# Patient Record
Sex: Male | Born: 1970 | Race: White | Hispanic: No | Smoking: Former smoker
Health system: Southern US, Community
[De-identification: ages and names within clinical notes are randomized; demographics above are authoritative.]

## PROBLEM LIST (undated history)

## (undated) DIAGNOSIS — I5022 Chronic systolic (congestive) heart failure: Secondary | ICD-10-CM

## (undated) DIAGNOSIS — Z95810 Presence of automatic (implantable) cardiac defibrillator: Secondary | ICD-10-CM

## (undated) DIAGNOSIS — J189 Pneumonia, unspecified organism: Secondary | ICD-10-CM

## (undated) DIAGNOSIS — I739 Peripheral vascular disease, unspecified: Secondary | ICD-10-CM

## (undated) DIAGNOSIS — I219 Acute myocardial infarction, unspecified: Secondary | ICD-10-CM

## (undated) DIAGNOSIS — I10 Essential (primary) hypertension: Secondary | ICD-10-CM

## (undated) DIAGNOSIS — R0600 Dyspnea, unspecified: Secondary | ICD-10-CM

## (undated) DIAGNOSIS — I2510 Atherosclerotic heart disease of native coronary artery without angina pectoris: Secondary | ICD-10-CM

## (undated) DIAGNOSIS — K219 Gastro-esophageal reflux disease without esophagitis: Secondary | ICD-10-CM

## (undated) DIAGNOSIS — I82409 Acute embolism and thrombosis of unspecified deep veins of unspecified lower extremity: Secondary | ICD-10-CM

## (undated) HISTORY — PX: KNEE ARTHROSCOPY: SUR90

## (undated) HISTORY — PX: SHOULDER ARTHROSCOPY: SHX128

## (undated) HISTORY — PX: NASAL SEPTUM SURGERY: SHX37

## (undated) HISTORY — PX: WRIST SURGERY: SHX841

## (undated) HISTORY — PX: CHOLECYSTECTOMY: SHX55

## (undated) HISTORY — PX: OTHER SURGICAL HISTORY: SHX169

## (undated) HISTORY — PX: CARDIAC CATHETERIZATION: SHX172

## (undated) HISTORY — PX: CORONARY ANGIOPLASTY: SHX604

---
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
## 2011-08-17 DIAGNOSIS — J439 Emphysema, unspecified: Secondary | ICD-10-CM | POA: Insufficient documentation

## 2013-08-02 LAB — TSH: TSH: 0.68 (ref 0.41–5.90)

## 2014-03-10 DIAGNOSIS — I10 Essential (primary) hypertension: Secondary | ICD-10-CM | POA: Insufficient documentation

## 2014-03-10 DIAGNOSIS — K219 Gastro-esophageal reflux disease without esophagitis: Secondary | ICD-10-CM | POA: Insufficient documentation

## 2015-12-16 DIAGNOSIS — M1711 Unilateral primary osteoarthritis, right knee: Secondary | ICD-10-CM | POA: Insufficient documentation

## 2016-04-29 DIAGNOSIS — I2510 Atherosclerotic heart disease of native coronary artery without angina pectoris: Secondary | ICD-10-CM | POA: Insufficient documentation

## 2016-05-30 DIAGNOSIS — Z95810 Presence of automatic (implantable) cardiac defibrillator: Secondary | ICD-10-CM

## 2016-05-30 HISTORY — PX: ICD IMPLANT: EP1208

## 2016-05-30 HISTORY — DX: Presence of automatic (implantable) cardiac defibrillator: Z95.810

## 2016-07-28 DIAGNOSIS — N522 Drug-induced erectile dysfunction: Secondary | ICD-10-CM | POA: Insufficient documentation

## 2016-07-28 DIAGNOSIS — I252 Old myocardial infarction: Secondary | ICD-10-CM | POA: Insufficient documentation

## 2016-07-29 LAB — LIPID PANEL
Cholesterol: 112 (ref 0–200)
HDL: 34 — AB (ref 35–70)
LDL Cholesterol: 42
Triglycerides: 116 (ref 40–160)

## 2017-03-26 DIAGNOSIS — I213 ST elevation (STEMI) myocardial infarction of unspecified site: Secondary | ICD-10-CM | POA: Insufficient documentation

## 2017-03-27 DIAGNOSIS — Z95810 Presence of automatic (implantable) cardiac defibrillator: Secondary | ICD-10-CM | POA: Insufficient documentation

## 2017-03-27 DIAGNOSIS — I4901 Ventricular fibrillation: Secondary | ICD-10-CM | POA: Insufficient documentation

## 2017-03-27 DIAGNOSIS — I255 Ischemic cardiomyopathy: Secondary | ICD-10-CM | POA: Insufficient documentation

## 2017-10-13 LAB — HEPATIC FUNCTION PANEL
ALT: 24 (ref 10–40)
AST: 19 (ref 14–40)
Alkaline Phosphatase: 70 (ref 25–125)
Bilirubin, Total: 0.4

## 2017-10-13 LAB — CBC AND DIFFERENTIAL
HCT: 40 — AB (ref 41–53)
Hemoglobin: 13.5 (ref 13.5–17.5)
Platelets: 216 (ref 150–399)
WBC: 7.4

## 2017-10-13 LAB — HEMOGLOBIN A1C: Hemoglobin A1C: 5.6

## 2017-10-13 LAB — BASIC METABOLIC PANEL
BUN: 12 (ref 4–21)
Creatinine: 0.9 (ref 0.6–1.3)
Glucose: 95
Potassium: 4.5 (ref 3.4–5.3)
Sodium: 140 (ref 137–147)

## 2018-02-20 NOTE — H&P (Signed)
  Patient's anticipated LOS is less than 2 midnights, meeting these requirements: - Younger than 26 - Lives within 1 hour of care - Has a competent adult at home to recover with post-op recover - NO history of  - Chronic pain requiring opiods  - Diabetes  - Coronary Artery Disease  - Heart failure  - Heart attack  - Stroke  - DVT/VTE  - Cardiac arrhythmia  - Respiratory Failure/COPD  - Renal failure  - Anemia  - Advanced Liver disease       Shawn Burke is an 47 y.o. male.    Chief Complaint: right shoulder pain  HPI: Pt is a 47 y.o. male complaining of right shoulder pain for multiple months. Pain had continually increased since the beginning. X-rays in the clinic show rotator cuff tear right shoulder. Pt has tried various conservative treatments which have failed to alleviate their symptoms, including injections and therapy. Various options are discussed with the patient. Risks, benefits and expectations were discussed with the patient. Patient understand the risks, benefits and expectations and wishes to proceed with surgery.   PCP:  System, Pcp Not In  D/C Plans: Home  PMH: No past medical history on file.   Social History:  has no tobacco, alcohol, and drug history on file.  Allergies:  Allergies  Allergen Reactions  . Coconut Flavor Anaphylaxis, Shortness Of Breath and Swelling    Medications: No current facility-administered medications for this encounter.    Current Outpatient Medications  Medication Sig Dispense Refill  . aspirin EC 81 MG tablet Take 81 mg by mouth daily.    Marland Kitchen atorvastatin (LIPITOR) 80 MG tablet Take 80 mg by mouth daily.    . carvedilol (COREG) 6.25 MG tablet Take 6.25 mg by mouth 2 (two) times daily.    . GOODYS EXTRA STRENGTH 520-260-32.5 MG PACK Take 1 packet by mouth 3 (three) times daily as needed (for headaches/toothaches.).    Marland Kitchen pantoprazole (PROTONIX) 20 MG tablet Take 20 mg by mouth daily before breakfast.    .  spironolactone (ALDACTONE) 25 MG tablet Take 25 mg by mouth daily.    . clopidogrel (PLAVIX) 75 MG tablet Take 75 mg by mouth daily.      No results found for this or any previous visit (from the past 48 hour(s)). No results found.  ROS: Pain with rom of the right upper extremity  Physical Exam: Alert and oriented 47 y.o. male in no acute distress Cranial nerves 2-12 intact Cervical spine: full rom with no tenderness, nv intact distally Chest: active breath sounds bilaterally, no wheeze rhonchi or rales Heart: regular rate and rhythm, no murmur Abd: non tender non distended with active bowel sounds Hip is stable with rom  Right shoulder with mild decrease with rom due to pain nv intact distally No rashes or edema Strength limited to known rotator cuff tear  Assessment/Plan Assessment: right shoulder rotator cuff tear   Plan:  Patient will undergo a right shoulder rotator cuff repair by Dr. Veverly Fells at [facility]. Risks benefits and expectations were discussed with the patient. Patient understand risks, benefits and expectations and wishes to proceed. Preoperative templating of the joint replacement has been completed, documented, and submitted to the Operating Room personnel in order to optimize intra-operative equipment management.   Merla Riches PA-C, MPAS [facility] is now [employer] [address] [place], [place], [place] [address] Phone: [phone] [email or web address] Facebook  [employer]

## 2018-02-22 ENCOUNTER — Encounter (HOSPITAL_COMMUNITY): Payer: Self-pay | Admitting: *Deleted

## 2018-02-22 ENCOUNTER — Other Ambulatory Visit: Payer: Self-pay

## 2018-02-22 NOTE — Progress Notes (Signed)
Spoke with pt for pre-op call. Pt has extensive cardiac history with CAD and stents, multiple MI's and cardiac arrests and an ICD. Pt's cardiology care is in [place] with [place]. Pt's cardiologist is Dr. Trixie Rude at [facility] and V[facility] and Dr. Domenica Fail is his electrophysiologist at [facility] and Vascular at [facility]. Have faxed ICD reprogramming order form to Dr. Hassie Bruce Device clinic. Pt denies any recent chest pain. States he gets sob with climbing stairs at work. Pt states he was told by "somebody" to stop his Plavix 5 days prior to surgery. Last dose was Sunday, 02/18/18. Continuing Aspirin. Pt states he is not diabetic. Spoke with Margarita Grizzle at Dr. Veverly Fells' office and she will fax the cardiac clearance note that they have received.

## 2018-02-22 NOTE — Progress Notes (Signed)
Paged [employer] rep for pt's ICD.

## 2018-02-22 NOTE — Progress Notes (Addendum)
Anesthesia Chart Review: SAME DAY WORK-UP (patient lives is [place])  Case:  [id number] Date/Time:  02/23/18 1315   Procedure:  RIGHT SHOULDER ARTHROSCOPY WITH OPEN ROTATOR CUFF REPAIR, SUBACROMIAL DECOMPRESSION, OPEN DISTAL CLAVICLE RESECTION, OPEN BICEP TENODESIS (Right Shoulder)   Anesthesia type:  Choice   Pre-op diagnosis:  Right shoulder rotator cuff tear   Location:  [facility] OR ROOM 05 / [place] [facility]   Surgeon:  Netta Cedars, MD      DISCUSSION: Patient is a 47 year old male scheduled for the above procedure.  History includes former smoker, CAD/MI (anterior STEMI s/p mLAD stent 04/25/16; inferior STEMI due to pRCA thrombus, s/p DES RCA and PTCA mPDA, residual 80-90% pLAD lesion, LHC complicated by peri-PCI VF arrest with 23 ICD shocks for both VF and afib 03/27/17; inferior STEMI due occluded small distal PDA, no PCI 03/31/17; s/p DES to oLAD and mLAD 04/10/17), ischemic cardiomyopathy, ICD (per EP nurse Juliann Pulse at Dr. Hassie Bruce office: "[employer] Inventra 7VR-T DX single lead RV ICD, serial number [id number]", implanted 09/23/16, left sided; interrogation: remote 01/02/18, office 07/05/17, "underlying sinus rhythm, 0% V-pacing"; PAF noted on 03/30/17 interrogation, afib burden 0.1%), HTN, RLE DVT ~ 2011, GERD.   RCA/PDA disease addressed 03/27/17 [facility]), but procedure complicated by VF arrest, so residual LAD disease not addressed until 04/10/17 [facility]) with PDA re-occluded by that time (03/31/17 [facility], no intervention). He had a high risk stress test 06/2017, but primarily due to low EF as there was no significant ischemia. Last EP cardiology visit 01/26/18 and no additional testing recommended at that time (see below).  - PCP Toma Deiters, FNP cleared for surgery at "moderate risk." - EP cardiologist Dr. Domenica Fail cleared for surgery at "moderate risk." He gave permission to hold Plavix x5 days.  Patient reported last Plavix dose 02/18/18, but is continuing ASA. Reviewed with anesthesiologist  Suella Broad, MD earlier today while awaiting [place] records. Fortunately received records since then confirming that LAD disease has also been addressed. Dr. Hassie Bruce staff would not complete the [facility] cardiac device perioperative form due to being outside of their facility but did fax general [employer] recommendations (on chart). Anesthesiologist to evaluate on the day of surgery. Patient will need labs on the day of surgery.   PROVIDERS: - Toma Deiters, FNP is PCP ([place]; see Care Everywhere). Last visit 02/14/18. Aware of surgery plans. Lisinopril held due to hypotension and call made to cardiology to discuss.   Wende Crease, MD is primary ardiologist at [facility] and Vascular and EP cardiologist is Joylene John, MD. Both are located at [facility] [phone]). Last seen by Dr. Domenica Fail on 01/26/18 for follow-up and preoperative evaluation. (Office note is on chart for review as needed.)    LABS: He is for labs on arrival. In 09/2017 his H/H were 13.5/39.9, PLT 216 and CMET was WNL except elevated albumin/globulin ratio of 2.5 (1.2-2.2).    IMAGES: MRI right shoulder ([facility] Everywhere): IMPRESSION: 1.High-grade bursal surface tear of the anterior mid supraspinatus. 2.Low-grade bursal surface tear of the anteriormost infraspinatus. 3.Low-grade partial articular surface tear of the superiormost subscapularis. 4.Mild acromioclavicular joint degenerative changes. Subacromial spurring and mild subacromial/subdeltoid bursitis. Findings may contribute to impingement. 5.Motion limited examination.   EKG:  - 01/26/18 ([place]): Sinus Rhythm. Anterior infarct, probably recent. Inferior infarct (age undetermined). - 09/07/17 ([place]): SinusRhythm.Extensive anterior-lateral negative T wave, consider lateral ischemia. Inferior infarct(age undetermined)      CV:  Nuclear stress test 07/20/17 ([place]): Conclusions: 1. Myocardial perfusion stress study is abnormal as described below but there is no active ischemia. 2. There is a fixed, large size, severe grade perfusion defect involving the entire apical and periapical segments indicating prior myocardial infarct/scar. In addition, there is a predominantly fixed, large size, moderate grade perfusion defect involving the inferior and inferoseptal walls indicating prior myocardial infarction/scar. No significant reversible perfusion defects suggesting no significant stress-induced myocardial ischemia. 3. Overall left ventricular global systolic function is moderately depressed. The left ventricular end-diastolic volume is 474 mL. The overall calculated left ventricular ejection fraction is 30%. Severe hypokinesis of the inferior, inferoseptal, and apical wall of the left ventricle. 4. High cardiovascular risk study. Degree of risk is primarily based on significantly reduced LVEF.  Cardiac cath/PCI 04/10/17 ([place]): Summary: 1. LAD:  Ostial lesion: 75% stenosis. Balloon angioplasty. 3.83mm x 18 mm Xience Alpine DES placed. 0% residual stenosis with TIMI grade 3 flow (brisk flow).  Mid lesion: 75% stenosis. 2.92mm x 12 mm Xience Alpine DES placed. 0% residual stenosis with TIMI grade 3 flow (brisk flow). Distal lesion: 25%. 2. LCX: Mid lesion: 25% stenosis. Distal lesion: 25% stenosis. 3. OM1: Ostial lesion: 25% stenosis. 4. OM2: Ostial lesion: 50% stenosis. 5. RPDA: Mid lesion: 50% stenosis. 6. LV: Estimated EF 30%. Sever hypokinesis of the apical myocardium. 7. AV: There is no stenosis. 8. MV: There is no regurgitation. 9. 120/6. 9 mm Hg LVEDP, dP/dt=1208 mm Hg/s  Cardiac cath 03/31/17 [facility] [place]):  Procedure Findings 1. Patent recent proximal RCA stent is widely patent with no concerns. 2. The distal rPDA is occluded; this was previously treated with angioplasty. 3. Residual proximal  LAD has severe 80% narrowing.Previous mid LAD stent is widely patent.Beyond the stented segment has focal, eccentric 60-70% narrowed. Plan: culprit for patient's post AMI pain is small distal PDA vessel that was previous treated with only angioplasty; this small branch is now occluded.Repeat PCI not performed. Proximal stent is widely patent with no concerns. Medical therapy.  Cardiac cath 03/27/17 ([place]): SUMMARY: --HEMODYNAMICS: --Hemodynamic assessment demonstrates mildly to moderately elevated LVEDP. --CORONARY CIRCULATION: --Left main: The vessel was large sized. Angiography showed moderate atherosclerosis.  --Proximal LAD: The vessel was large sized. Angiography showed severe atherosclerosis. There was a tubular 90 % stenosis.  --Mid LAD: The vessel was large sized. Angiography showed severe atherosclerosis. There  was a discrete 70 % stenosis. -- Proximal circumflex: The vessel was medium to large sized. Angiography showed moderate atherosclerosis.  --Mid circumflex: The vessel was large sized. Angiography showed moderate atherosclerosis. --3rd obtuse marginal: The vessel was medium sized. Angiography showed moderate atherosclerosis.  --Proximal RCA: The vessel was large sized (dominant). Angiography showed severe atherosclerosis. There was a discrete 99 % stenosis. The lesion was was associated with a large filling defect consistent with thrombus. There was TIMI grade 2 flow through the vessel (partial perfusion). This lesion is a likely culprit for the patient's recent myocardial infarction. It appears amenable to percutaneous intervention.  --Right PDA: The vessel was medium to large sized. Angiography showed severe atherosclerosis. There was a discrete 90 % stenosis. -- INTERVENTIONS/PCI: --A successful stent was performed on the 99 % lesion in the proximal RCA. Following intervention there was an excellent angiographic appearance with a  0 % residual stenosis. --A balloon angioplasty was performed on the 90 % lesion in the right posterior descending artery. Following intervention there was an improved angiographic appearance with  a 50 % residual stenosis. -- SUMMARY: --Acute Inferior STEMI with 99% proximal RCA lesion with heavy thrombus culprit for MI, 90% proximal LAD, 90% RPDA and moderate LCX and LM stenoses. Successful PCI to proximal RCA with placement of a 3.5 X 28 mm Synergy DES and PTCA to the mid PDA lesion. Patient did well but went into VF arrest upon moving from cath table to bed, he was resucitated and intubated. Admitted to CCU.  Echo 03/27/17 ([place]): Interpretation Summary 1. Left ventricle is normal in size with severely reduced function with an EF 25-30%. There is akinesis of the apex, mid/distal septum, and severe hypokinesis of the mid/distal anterior wall, and mid/distal inferior wall. Contrast echo was administered and no obvious LV thrombus. 2. Right ventricle is normal in size and function. Pacer/device lead is noted in right heart. 3. Trivial mitral regurgitation. 4. Atria are normal in size. 5. No pericardial effusion.   Past Medical History:  Diagnosis Date  . AICD (automatic cardioverter/defibrillator) present   . Coronary artery disease    2017 - 1 stent. 5 in October 2018  . GERD (gastroesophageal reflux disease)   . Hypertension   . Myocardial infarction ([place])    x 2 in 02/2017  . Peripheral vascular disease ([facility])    DVT in right leg  . Pneumonia     Past Surgical History:  Procedure Laterality Date  . CARDIAC CATHETERIZATION    . CHOLECYSTECTOMY    . CORONARY ANGIOPLASTY    . KNEE ARTHROSCOPY Right    x 2  . NASAL SEPTUM SURGERY      x 3  . SHOULDER ARTHROSCOPY Left   . WRIST SURGERY Right     MEDICATIONS: No current facility-administered medications for this encounter.    Marland Kitchen aspirin EC 81 MG tablet  . atorvastatin (LIPITOR) 80 MG tablet  .  carvedilol (COREG) 6.25 MG tablet  . GOODYS EXTRA STRENGTH 520-260-32.5 MG PACK  . pantoprazole (PROTONIX) 20 MG tablet  . spironolactone (ALDACTONE) 25 MG tablet  . clopidogrel (PLAVIX) 75 MG tablet    [facility]/Anesthesiology Phone [phone] 02/22/2018 6:46 PM

## 2018-02-22 NOTE — Progress Notes (Signed)
   02/22/18 1202  OBSTRUCTIVE SLEEP APNEA  Have you ever been diagnosed with sleep apnea through a sleep study? No  Do you snore loudly (loud enough to be heard through closed doors)?  1  Do you often feel tired, fatigued, or sleepy during the daytime (such as falling asleep during driving or talking to someone)? 1  Has anyone observed you stop breathing during your sleep? 1  Do you have, or are you being treated for high blood pressure? 1  BMI more than 35 kg/m2? 0  Age > 75 (1-yes) 0  Male Gender (Yes=1) 1  Obstructive Sleep Apnea Score 5  Score 5 or greater  Results sent to PCP

## 2018-02-23 ENCOUNTER — Encounter (HOSPITAL_COMMUNITY): Payer: Self-pay

## 2018-02-23 ENCOUNTER — Ambulatory Visit (HOSPITAL_COMMUNITY): Admitting: Vascular Surgery

## 2018-02-23 ENCOUNTER — Encounter (HOSPITAL_COMMUNITY)
Admission: RE | Disposition: A | Discharge status: Home / Self Care | Payer: Self-pay | Source: Ambulatory Visit | Attending: Orthopedic Surgery

## 2018-02-23 ENCOUNTER — Ambulatory Visit (HOSPITAL_COMMUNITY)
Admission: RE | Admit: 2018-02-23 | Discharge: 2018-02-23 | Disposition: A | Discharge status: Home / Self Care | Source: Ambulatory Visit | Attending: Orthopedic Surgery | Admitting: Orthopedic Surgery

## 2018-02-23 DIAGNOSIS — Z86718 Personal history of other venous thrombosis and embolism: Secondary | ICD-10-CM | POA: Diagnosis not present

## 2018-02-23 DIAGNOSIS — Z87891 Personal history of nicotine dependence: Secondary | ICD-10-CM | POA: Diagnosis not present

## 2018-02-23 DIAGNOSIS — I2510 Atherosclerotic heart disease of native coronary artery without angina pectoris: Secondary | ICD-10-CM | POA: Insufficient documentation

## 2018-02-23 DIAGNOSIS — Z7982 Long term (current) use of aspirin: Secondary | ICD-10-CM | POA: Diagnosis not present

## 2018-02-23 DIAGNOSIS — I252 Old myocardial infarction: Secondary | ICD-10-CM | POA: Insufficient documentation

## 2018-02-23 DIAGNOSIS — K219 Gastro-esophageal reflux disease without esophagitis: Secondary | ICD-10-CM | POA: Insufficient documentation

## 2018-02-23 DIAGNOSIS — Z7902 Long term (current) use of antithrombotics/antiplatelets: Secondary | ICD-10-CM | POA: Diagnosis not present

## 2018-02-23 DIAGNOSIS — M75101 Unspecified rotator cuff tear or rupture of right shoulder, not specified as traumatic: Secondary | ICD-10-CM | POA: Insufficient documentation

## 2018-02-23 DIAGNOSIS — X58XXXA Exposure to other specified factors, initial encounter: Secondary | ICD-10-CM | POA: Diagnosis not present

## 2018-02-23 DIAGNOSIS — Z955 Presence of coronary angioplasty implant and graft: Secondary | ICD-10-CM | POA: Diagnosis not present

## 2018-02-23 DIAGNOSIS — S43431A Superior glenoid labrum lesion of right shoulder, initial encounter: Secondary | ICD-10-CM | POA: Insufficient documentation

## 2018-02-23 DIAGNOSIS — I739 Peripheral vascular disease, unspecified: Secondary | ICD-10-CM | POA: Insufficient documentation

## 2018-02-23 DIAGNOSIS — Z79899 Other long term (current) drug therapy: Secondary | ICD-10-CM | POA: Insufficient documentation

## 2018-02-23 DIAGNOSIS — I10 Essential (primary) hypertension: Secondary | ICD-10-CM | POA: Insufficient documentation

## 2018-02-23 DIAGNOSIS — M19011 Primary osteoarthritis, right shoulder: Secondary | ICD-10-CM | POA: Insufficient documentation

## 2018-02-23 DIAGNOSIS — Z95810 Presence of automatic (implantable) cardiac defibrillator: Secondary | ICD-10-CM | POA: Insufficient documentation

## 2018-02-23 HISTORY — DX: Atherosclerotic heart disease of native coronary artery without angina pectoris: I25.10

## 2018-02-23 HISTORY — PX: SHOULDER ARTHROSCOPY WITH ROTATOR CUFF REPAIR AND SUBACROMIAL DECOMPRESSION: SHX5686

## 2018-02-23 HISTORY — DX: Presence of automatic (implantable) cardiac defibrillator: Z95.810

## 2018-02-23 HISTORY — DX: Pneumonia, unspecified organism: J18.9

## 2018-02-23 HISTORY — DX: Peripheral vascular disease, unspecified: I73.9

## 2018-02-23 HISTORY — DX: Acute myocardial infarction, unspecified: I21.9

## 2018-02-23 HISTORY — DX: Essential (primary) hypertension: I10

## 2018-02-23 HISTORY — DX: Gastro-esophageal reflux disease without esophagitis: K21.9

## 2018-02-23 LAB — COMPREHENSIVE METABOLIC PANEL
ALK PHOS: 60 U/L (ref 38–126)
ALT: 22 U/L (ref 0–44)
AST: 20 U/L (ref 15–41)
Albumin: 4.1 g/dL (ref 3.5–5.0)
Anion gap: 9 (ref 5–15)
BUN: 7 mg/dL (ref 6–20)
CALCIUM: 9.1 mg/dL (ref 8.9–10.3)
CO2: 25 mmol/L (ref 22–32)
Chloride: 108 mmol/L (ref 98–111)
Creatinine, Ser: 0.81 mg/dL (ref 0.61–1.24)
GFR calc Af Amer: >60 mL/min (ref >60)
GFR calc non Af Amer: >60 mL/min (ref >60)
GLUCOSE: 97 mg/dL (ref 70–99)
Potassium: 3.8 mmol/L (ref 3.5–5.1)
SODIUM: 142 mmol/L (ref 135–145)
Total Bilirubin: 1.1 mg/dL (ref 0.3–1.2)
Total Protein: 6.8 g/dL (ref 6.5–8.1)

## 2018-02-23 LAB — CBC
HCT: 42.2 % (ref 39.0–52.0)
HEMOGLOBIN: 13.8 g/dL (ref 13.0–17.0)
MCH: 33.2 pg (ref 26.0–34.0)
MCHC: 32.7 g/dL (ref 30.0–36.0)
MCV: 101.4 fL — ABNORMAL HIGH (ref 78.0–100.0)
Platelets: 205 10*3/uL (ref 150–400)
RBC: 4.16 MIL/uL — AB (ref 4.22–5.81)
RDW: 12.4 % (ref 11.5–15.5)
WBC: 7.9 10*3/uL (ref 4.0–10.5)

## 2018-02-23 SURGERY — SHOULDER ARTHROSCOPY WITH ROTATOR CUFF REPAIR AND SUBACROMIAL DECOMPRESSION
Anesthesia: General | Site: Shoulder | Laterality: Right

## 2018-02-23 MED ORDER — EPHEDRINE 5 MG/ML INJ
INTRAVENOUS | Status: AC
  Filled 2018-02-23: qty 10

## 2018-02-23 MED ORDER — BUPIVACAINE-EPINEPHRINE 0.25% -1:200000 IJ SOLN
INTRAMUSCULAR | Status: DC | PRN
  Administered 2018-02-23: 8 mL

## 2018-02-23 MED ORDER — ROCURONIUM BROMIDE 10 MG/ML (PF) SYRINGE
PREFILLED_SYRINGE | INTRAVENOUS | Status: DC | PRN
  Administered 2018-02-23: 50 mg via INTRAVENOUS

## 2018-02-23 MED ORDER — FENTANYL CITRATE (PF) 100 MCG/2ML IJ SOLN
50.0000 ug | Freq: Once | INTRAMUSCULAR | Status: AC
  Administered 2018-02-23: 50 ug via INTRAVENOUS

## 2018-02-23 MED ORDER — ETOMIDATE 2 MG/ML IV SOLN
INTRAVENOUS | Status: AC
  Filled 2018-02-23: qty 10

## 2018-02-23 MED ORDER — METHOCARBAMOL 500 MG PO TABS: 500.0000 mg | ORAL_TABLET | Freq: Three times a day (TID) | ORAL | 1 refills | Status: DC | PRN

## 2018-02-23 MED ORDER — ETOMIDATE 2 MG/ML IV SOLN
INTRAVENOUS | Status: DC | PRN
  Administered 2018-02-23: 20 mg via INTRAVENOUS

## 2018-02-23 MED ORDER — CHLORHEXIDINE GLUCONATE 4 % EX LIQD: 60.0000 mL | Freq: Once | CUTANEOUS | Status: DC

## 2018-02-23 MED ORDER — ONDANSETRON HCL 4 MG/2ML IJ SOLN
INTRAMUSCULAR | Status: DC | PRN
  Administered 2018-02-23: 4 mg via INTRAVENOUS

## 2018-02-23 MED ORDER — ONDANSETRON HCL 4 MG/2ML IJ SOLN
INTRAMUSCULAR | Status: AC
  Filled 2018-02-23: qty 2

## 2018-02-23 MED ORDER — ROCURONIUM BROMIDE 50 MG/5ML IV SOSY
PREFILLED_SYRINGE | INTRAVENOUS | Status: AC
  Filled 2018-02-23: qty 5

## 2018-02-23 MED ORDER — BUPIVACAINE LIPOSOME 1.3 % IJ SUSP
INTRAMUSCULAR | Status: DC | PRN
  Administered 2018-02-23: 10 mL via PERINEURAL

## 2018-02-23 MED ORDER — FENTANYL CITRATE (PF) 250 MCG/5ML IJ SOLN
INTRAMUSCULAR | Status: DC | PRN
  Administered 2018-02-23: 200 ug via INTRAVENOUS

## 2018-02-23 MED ORDER — SODIUM CHLORIDE 0.9 % IR SOLN
Status: DC | PRN
  Administered 2018-02-23: 1000 mL

## 2018-02-23 MED ORDER — OXYCODONE-ACETAMINOPHEN 7.5-325 MG PO TABS: 1.0000 | ORAL_TABLET | ORAL | 0 refills | Status: DC | PRN

## 2018-02-23 MED ORDER — FENTANYL CITRATE (PF) 250 MCG/5ML IJ SOLN
INTRAMUSCULAR | Status: AC
  Filled 2018-02-23: qty 5

## 2018-02-23 MED ORDER — MIDAZOLAM HCL 2 MG/2ML IJ SOLN
INTRAMUSCULAR | Status: AC
  Administered 2018-02-23: 1 mg via INTRAVENOUS
  Filled 2018-02-23: qty 2

## 2018-02-23 MED ORDER — GLYCOPYRROLATE PF 0.2 MG/ML IJ SOSY
PREFILLED_SYRINGE | INTRAMUSCULAR | Status: DC | PRN
  Administered 2018-02-23: .2 mg via INTRAVENOUS

## 2018-02-23 MED ORDER — PROPOFOL 10 MG/ML IV BOLUS
INTRAVENOUS | Status: AC
  Filled 2018-02-23: qty 20

## 2018-02-23 MED ORDER — MIDAZOLAM HCL 2 MG/2ML IJ SOLN
1.0000 mg | Freq: Once | INTRAMUSCULAR | Status: AC
  Administered 2018-02-23: 1 mg via INTRAVENOUS

## 2018-02-23 MED ORDER — BUPIVACAINE-EPINEPHRINE (PF) 0.25% -1:200000 IJ SOLN
INTRAMUSCULAR | Status: AC
  Filled 2018-02-23: qty 30

## 2018-02-23 MED ORDER — BUPIVACAINE HCL (PF) 0.5 % IJ SOLN
INTRAMUSCULAR | Status: DC | PRN
  Administered 2018-02-23: 10 mL via PERINEURAL

## 2018-02-23 MED ORDER — SODIUM CHLORIDE 0.9 % IV SOLN
INTRAVENOUS | Status: DC | PRN
  Administered 2018-02-23: 30 ug/min via INTRAVENOUS

## 2018-02-23 MED ORDER — ONDANSETRON HCL 4 MG/2ML IJ SOLN
4.0000 mg | Freq: Once | INTRAMUSCULAR | Status: AC | PRN
  Administered 2018-02-23: 4 mg via INTRAVENOUS

## 2018-02-23 MED ORDER — FENTANYL CITRATE (PF) 100 MCG/2ML IJ SOLN: 25.0000 ug | INTRAMUSCULAR | Status: DC | PRN

## 2018-02-23 MED ORDER — FENTANYL CITRATE (PF) 100 MCG/2ML IJ SOLN
INTRAMUSCULAR | Status: AC
  Administered 2018-02-23: 50 ug via INTRAVENOUS
  Filled 2018-02-23: qty 2

## 2018-02-23 MED ORDER — EPHEDRINE SULFATE-NACL 50-0.9 MG/10ML-% IV SOSY
PREFILLED_SYRINGE | INTRAVENOUS | Status: DC | PRN
  Administered 2018-02-23: 10 mg via INTRAVENOUS

## 2018-02-23 MED ORDER — DEXAMETHASONE SODIUM PHOSPHATE 10 MG/ML IJ SOLN
INTRAMUSCULAR | Status: AC
  Filled 2018-02-23: qty 1

## 2018-02-23 MED ORDER — GLYCOPYRROLATE PF 0.2 MG/ML IJ SOSY
PREFILLED_SYRINGE | INTRAMUSCULAR | Status: AC
  Filled 2018-02-23: qty 1

## 2018-02-23 MED ORDER — CEFAZOLIN SODIUM-DEXTROSE 2-4 GM/100ML-% IV SOLN
2.0000 g | INTRAVENOUS | Status: AC
  Administered 2018-02-23: 2 g via INTRAVENOUS
  Filled 2018-02-23: qty 100

## 2018-02-23 MED ORDER — DEXAMETHASONE SODIUM PHOSPHATE 10 MG/ML IJ SOLN
INTRAMUSCULAR | Status: DC | PRN
  Administered 2018-02-23: 10 mg via INTRAVENOUS

## 2018-02-23 MED ORDER — LACTATED RINGERS IV SOLN
INTRAVENOUS | Status: DC
  Administered 2018-02-23: 11:00:00 via INTRAVENOUS

## 2018-02-23 MED ORDER — SUGAMMADEX SODIUM 200 MG/2ML IV SOLN
INTRAVENOUS | Status: DC | PRN
  Administered 2018-02-23: 200 mg via INTRAVENOUS

## 2018-02-23 SURGICAL SUPPLY — 76 items
ANCHOR ALL SUT RC W2 1.3 RIB (Anchor) ×4 IMPLANT
ANCHOR ALL-SUT FLEX 1.3 Y-KNOT (Anchor) ×3 IMPLANT
ANCHOR ALL-SUT RC W2 1.3 RIB (Anchor) ×2 IMPLANT
BIT DRILL 1.3M DISPOSABLE (BIT) ×3 IMPLANT
BLADE LONG MED 31MMX9MM (MISCELLANEOUS)
BLADE LONG MED 31X9 (MISCELLANEOUS) IMPLANT
BLADE SURG 11 STRL SS (BLADE) ×3 IMPLANT
BUR OVAL 4.0 (BURR) IMPLANT
CLOSURE STERI-STRIP 1/2X4 (GAUZE/BANDAGES/DRESSINGS) ×1
CLOSURE WOUND 1/2 X4 (GAUZE/BANDAGES/DRESSINGS) ×1
CLSR STERI-STRIP ANTIMIC 1/2X4 (GAUZE/BANDAGES/DRESSINGS) ×2 IMPLANT
COVER SURGICAL LIGHT HANDLE (MISCELLANEOUS) ×3 IMPLANT
DRAPE INCISE IOBAN 66X45 STRL (DRAPES) ×3 IMPLANT
DRAPE ORTHO SPLIT 77X108 STRL (DRAPES) ×4
DRAPE STERI 35X30 U-POUCH (DRAPES) ×3 IMPLANT
DRAPE SURG ORHT 6 SPLT 77X108 (DRAPES) ×2 IMPLANT
DRAPE U-SHAPE 47X51 STRL (DRAPES) ×3 IMPLANT
DRILL BIT 5/64 (BIT) ×3 IMPLANT
DRSG ADAPTIC 3X8 NADH LF (GAUZE/BANDAGES/DRESSINGS) ×3 IMPLANT
DRSG EMULSION OIL 3X3 NADH (GAUZE/BANDAGES/DRESSINGS) ×6 IMPLANT
DRSG PAD ABDOMINAL 8X10 ST (GAUZE/BANDAGES/DRESSINGS) ×6 IMPLANT
DURAPREP 26ML APPLICATOR (WOUND CARE) ×3 IMPLANT
ELECT NEEDLE TIP 2.8 STRL (NEEDLE) ×3 IMPLANT
ELECT REM PT RETURN 9FT ADLT (ELECTROSURGICAL)
ELECTRODE REM PT RTRN 9FT ADLT (ELECTROSURGICAL) IMPLANT
GAUZE SPONGE 4X4 12PLY STRL (GAUZE/BANDAGES/DRESSINGS) ×3 IMPLANT
GLOVE BIOGEL PI ORTHO PRO 7.5 (GLOVE) ×2
GLOVE BIOGEL PI ORTHO PRO SZ8 (GLOVE) ×2
GLOVE ORTHO TXT STRL SZ7.5 (GLOVE) ×3 IMPLANT
GLOVE PI ORTHO PRO STRL 7.5 (GLOVE) ×1 IMPLANT
GLOVE PI ORTHO PRO STRL SZ8 (GLOVE) ×1 IMPLANT
GLOVE SURG ORTHO 8.5 STRL (GLOVE) ×3 IMPLANT
GOWN STRL REUS W/ TWL LRG LVL3 (GOWN DISPOSABLE) ×2 IMPLANT
GOWN STRL REUS W/ TWL XL LVL3 (GOWN DISPOSABLE) ×2 IMPLANT
GOWN STRL REUS W/TWL LRG LVL3 (GOWN DISPOSABLE) ×4
GOWN STRL REUS W/TWL XL LVL3 (GOWN DISPOSABLE) ×4
KIT BASIN OR (CUSTOM PROCEDURE TRAY) ×3 IMPLANT
KIT JUGGERKNOT DISP 2.9MM (KITS) IMPLANT
KIT TURNOVER KIT B (KITS) ×3 IMPLANT
MANIFOLD NEPTUNE II (INSTRUMENTS) ×3 IMPLANT
NDL SUT 6 .5 CRC .975X.05 MAYO (NEEDLE) IMPLANT
NEEDLE HYPO 25GX1X1/2 BEV (NEEDLE) ×3 IMPLANT
NEEDLE MAYO TAPER (NEEDLE)
NEEDLE SPNL 18GX3.5 QUINCKE PK (NEEDLE) ×3 IMPLANT
NS IRRIG 1000ML POUR BTL (IV SOLUTION) ×3 IMPLANT
PACK SHOULDER (CUSTOM PROCEDURE TRAY) ×3 IMPLANT
PAD ABD 8X10 STRL (GAUZE/BANDAGES/DRESSINGS) ×3 IMPLANT
PAD ARMBOARD 7.5X6 YLW CONV (MISCELLANEOUS) ×6 IMPLANT
RESECTOR FULL RADIUS 4.2MM (BLADE) IMPLANT
SET ARTHROSCOPY TUBING (MISCELLANEOUS) ×2
SET ARTHROSCOPY TUBING LN (MISCELLANEOUS) ×1 IMPLANT
SET JUGGERKNOT DISP 1.4MM (SET/KITS/TRAYS/PACK) IMPLANT
SLING ARM FOAM STRAP LRG (SOFTGOODS) ×3 IMPLANT
SLING ARM FOAM STRAP MED (SOFTGOODS) IMPLANT
SPONGE LAP 4X18 RFD (DISPOSABLE) ×3 IMPLANT
STRIP CLOSURE SKIN 1/2X4 (GAUZE/BANDAGES/DRESSINGS) ×2 IMPLANT
SUCTION FRAZIER HANDLE 10FR (MISCELLANEOUS) ×2
SUCTION TUBE FRAZIER 10FR DISP (MISCELLANEOUS) ×1 IMPLANT
SUT BONE WAX W31G (SUTURE) IMPLANT
SUT FIBERWIRE #2 38 T-5 BLUE (SUTURE)
SUT HI-FI 2 STRAND C-2 40 (SUTURE) ×6 IMPLANT
SUT MNCRL AB 4-0 PS2 18 (SUTURE) ×3 IMPLANT
SUT VIC AB 0 CT1 27 (SUTURE)
SUT VIC AB 0 CT1 27XBRD ANBCTR (SUTURE) IMPLANT
SUT VIC AB 0 CT2 27 (SUTURE) IMPLANT
SUT VIC AB 2-0 CT1 27 (SUTURE)
SUT VIC AB 2-0 CT1 TAPERPNT 27 (SUTURE) IMPLANT
SUTURE FIBERWR #2 38 T-5 BLUE (SUTURE) IMPLANT
SYR CONTROL 10ML LL (SYRINGE) ×3 IMPLANT
TAPE CLOTH SURG 6X10 WHT LF (GAUZE/BANDAGES/DRESSINGS) ×3 IMPLANT
TOWEL OR 17X24 6PK STRL BLUE (TOWEL DISPOSABLE) ×3 IMPLANT
TOWEL OR 17X26 10 PK STRL BLUE (TOWEL DISPOSABLE) ×3 IMPLANT
TUBE CONNECTING 12'X1/4 (SUCTIONS) ×1
TUBE CONNECTING 12X1/4 (SUCTIONS) ×2 IMPLANT
WAND HAND CNTRL MULTIVAC 90 (MISCELLANEOUS) ×3 IMPLANT
WATER STERILE IRR 1000ML POUR (IV SOLUTION) ×3 IMPLANT

## 2018-02-23 NOTE — Progress Notes (Signed)
Orthopedic Tech Progress Note Patient Details:  Shawn Burke 11/06/1970 [id number]  Ortho Devices Type of Ortho Device: Shoulder abduction pillow Ortho Device/Splint Location: requested by OR. Delivered to OR desk. Ortho Device/Splint Interventions: Renard Matter 02/23/2018, 3:42 PM

## 2018-02-23 NOTE — Anesthesia Procedure Notes (Signed)
Anesthesia Regional Block: Interscalene brachial plexus block   Pre-Anesthetic Checklist: ,, timeout performed, Correct Patient, Correct Site, Correct Laterality, Correct Procedure, Correct Position, site marked, Risks and benefits discussed,  Surgical consent,  Pre-op evaluation,  At surgeon's request and post-op pain management  Laterality: Right  Prep: chloraprep       Needles:  Injection technique: Single-shot  Needle Type: Echogenic Needle     Needle Length: 9cm  Needle Gauge: 21     Additional Needles:   Procedures:,,,, ultrasound used (permanent image in chart),,,,  Narrative:  Start time: 02/23/2018 1:24 PM End time: 02/23/2018 1:29 PM Injection made incrementally with aspirations every 5 mL.  Performed by: Personally  Anesthesiologist: Catalina Gravel, MD  Additional Notes: No pain on injection. No increased resistance to injection. Injection made in 5cc increments.  Good needle visualization.  Patient tolerated procedure well.

## 2018-02-23 NOTE — Anesthesia Postprocedure Evaluation (Signed)
Anesthesia Post Note  Patient: Shawn Burke  Procedure(s) Performed: RIGHT SHOULDER ARTHROSCOPY WITH OPEN ROTATOR CUFF REPAIR, SUBACROMIAL DECOMPRESSION, OPEN DISTAL CLAVICLE RESECTION, OPEN BICEP TENODESIS, LABRAL DEBRIDEMENT (Right Shoulder)     Patient location during evaluation: PACU Anesthesia Type: General and Regional Level of consciousness: awake and alert Pain management: pain level controlled Vital Signs Assessment: post-procedure vital signs reviewed and stable Respiratory status: spontaneous breathing, nonlabored ventilation, respiratory function stable and patient connected to nasal cannula oxygen Cardiovascular status: blood pressure returned to baseline and stable Postop Assessment: no apparent nausea or vomiting Anesthetic complications: no    Last Vitals:  Vitals:   02/23/18 1745 02/23/18 1800  BP: 129/86 123/81  Pulse: 86 79  Resp: 20 17  Temp:    SpO2: 93% 97%    Last Pain:  Vitals:   02/23/18 1745  TempSrc:   PainSc: 0-No pain                 Shawn Burke,W. EDMOND

## 2018-02-23 NOTE — Transfer of Care (Signed)
Immediate Anesthesia Transfer of Care Note  Patient: Shawn Burke  Procedure(s) Performed: RIGHT SHOULDER ARTHROSCOPY WITH OPEN ROTATOR CUFF REPAIR, SUBACROMIAL DECOMPRESSION, OPEN DISTAL CLAVICLE RESECTION, OPEN BICEP TENODESIS, LABRAL DEBRIDEMENT (Right Shoulder)  Patient Location: PACU  Anesthesia Type:GA combined with regional for post-op pain  Level of Consciousness: awake, alert , oriented and patient cooperative  Airway & Oxygen Therapy: Patient Spontanous Breathing and Patient connected to nasal cannula oxygen  Post-op Assessment: Report given to RN, Post -op Vital signs reviewed and stable and Patient moving all extremities  Post vital signs: Reviewed and stable  Last Vitals:  Vitals Value Taken Time  BP 130/90 02/23/2018  5:15 PM  Temp    Pulse 85 02/23/2018  5:28 PM  Resp 21 02/23/2018  5:28 PM  SpO2 92 % 02/23/2018  5:28 PM  Vitals shown include unvalidated device data.  Last Pain:  Vitals:   02/23/18 1028  TempSrc:   PainSc: 6       Patients Stated Pain Goal: 0 (71/24/58 0998)  Complications: No apparent anesthesia complications

## 2018-02-23 NOTE — Anesthesia Procedure Notes (Signed)
Procedure Name: Intubation Date/Time: 02/23/2018 2:58 PM Performed by: Myna Bright, CRNA Pre-anesthesia Checklist: Emergency Drugs available, Suction available, Patient identified and Patient being monitored Patient Re-evaluated:Patient Re-evaluated prior to induction Oxygen Delivery Method: Circle system utilized Preoxygenation: Pre-oxygenation with 100% oxygen Induction Type: IV induction Ventilation: Mask ventilation without difficulty Laryngoscope Size: Mac and 4 Grade View: Grade I Tube type: Oral Tube size: 7.5 mm Number of attempts: 1 Airway Equipment and Method: Stylet Placement Confirmation: ETT inserted through vocal cords under direct vision,  positive ETCO2 and breath sounds checked- equal and bilateral Secured at: 22 cm Tube secured with: Tape Dental Injury: Teeth and Oropharynx as per pre-operative assessment

## 2018-02-23 NOTE — Discharge Instructions (Signed)
Ice to the shoulder constantly.  Keep the incision covered and clean and dry  for 5 days, then ok to get it wet in the shower.  You can change the big bandage for BandAids on Sunday.  Keep covered and take a sponge bath until Wednesday when you can get it wet in the shower.  Do exercise as instructed every hour.  SHOULDER EXERCISES: Pendulum exercises - dangle your arm down swing in circles Lap slides - resting arm on pillow, arm of chair or lap, slide hand to knee and then back to your hip, back and forth Rotation exercises - Hug and Hitchhike- resting arm against side, rotate your palm into your abdomen (hug) and then turn your thumb out and rotate your arm out away from your body (hitch hike) go back and forth for several minutes  Icing and moving the shoulder are keys to reduce pain and swelling.  Do NOT push pull or lift with the arm.  Do NOT smoke or chew tobacco - that prevents tendon healing  No Driving for two weeks  DO NOT reach behind your back or push up out of a chair with the operative arm.  Use a sling while you are up and around for comfort, may remove while seated and hug a pillow.  Sleep in a recliner.   Follow up with Dr Veverly Fells in two weeks in the office, call [phone] for appt

## 2018-02-23 NOTE — Brief Op Note (Signed)
02/23/2018  5:15 PM  PATIENT:  Shawn Burke  47 y.o. male  PRE-OPERATIVE DIAGNOSIS:  Right shoulder rotator cuff tear, SLAP tear, AC DJD  POST-OPERATIVE DIAGNOSIS:  Right shoulder rotator cuff tear, SLAP tear, AC DJD  PROCEDURE:  Procedure(s): RIGHT SHOULDER ARTHROSCOPY WITH OPEN ROTATOR CUFF REPAIR, SUBACROMIAL DECOMPRESSION, OPEN DISTAL CLAVICLE RESECTION, OPEN BICEP TENODESIS, LABRAL DEBRIDEMENT (Right)  SURGEON:  Surgeon(s) and Role:    Netta Cedars, MD - Primary  PHYSICIAN ASSISTANT:   ASSISTANTS: Ventura Bruns, PA-C   ANESTHESIA:   regional and general  EBL:  50 mL   BLOOD ADMINISTERED:none  DRAINS: none   LOCAL MEDICATIONS USED:  MARCAINE     SPECIMEN:  No Specimen  DISPOSITION OF SPECIMEN:  N/A  COUNTS:  YES  TOURNIQUET:  * No tourniquets in log *  DICTATION: .Other Dictation: Dictation Number [id number]  PLAN OF CARE: Discharge to home after PACU  PATIENT DISPOSITION:  PACU - hemodynamically stable.   Delay start of Pharmacological VTE agent (>24hrs) due to surgical blood loss or risk of bleeding: not applicable

## 2018-02-23 NOTE — Anesthesia Procedure Notes (Signed)
Arterial Line Insertion Start/End9/27/2019 1:10 PM, 02/23/2018 1:20 PM Performed by: Gaylene Brooks, CRNA  Preanesthetic checklist: patient identified, IV checked, site marked, risks and benefits discussed, surgical consent, monitors and equipment checked, pre-op evaluation, timeout performed and anesthesia consent Lidocaine 1% used for infiltration Left, radial was placed Catheter size: 20 G Hand hygiene performed  and maximum sterile barriers used   Attempts: 1 Procedure performed without using ultrasound guided technique. Following insertion, dressing applied and Biopatch. Post procedure assessment: normal  Patient tolerated the procedure well with no immediate complications.

## 2018-02-23 NOTE — Op Note (Signed)
NAMEWILIAM, Shawn Burke MEDICAL RECORD CZ:[id number] ACCOUNT [id number] DATE OF BIRTH:1971/03/26 FACILITY: [facility] LOCATION: [facility] PHYSICIAN:STEVEN Orlena Sheldon, MD  OPERATIVE REPORT  DATE OF PROCEDURE:  02/23/2018  PREOPERATIVE DIAGNOSIS:  Right shoulder rotator cuff tear, SLAP tear and acromioclavicular joint arthritis.  POSTOPERATIVE DIAGNOSIS:  Right shoulder rotator cuff tear, SLAP tear and acromioclavicular joint arthritis.  PROCEDURE PERFORMED:  Right shoulder arthroscopy with extensive intraarticular debridement of torn superior labrum anterior, posterior as well as arthroscopic biceps tenotomy, arthroscopic subacromial decompression followed by mini open rotator cuff  repair and biceps tenodesis in the groove and open distal clavicle resection.  SURGEON:  Esmond Plants, MD  ASSISTANT:  Darol Destine, [place], who was scrubbed during the entire procedure and necessary for satisfactory completion of surgery.  ANESTHESIA:  General anesthesia was used plus interscalene block.  ESTIMATED BLOOD LOSS:  Minimal.  FLUID REPLACEMENT:  1200 mL crystalloid.  INSTRUMENT COUNTS:  Correct.  COMPLICATIONS:  There were no complications.  ANTIBIOTICS:  Perioperative antibiotics were given.  INDICATIONS:  The patient is a 47 year old male with history of worsening right shoulder pain and dysfunction after a work injury to his right dominant shoulder.  The patient has had ongoing pain and functional limitations.  He has had a preoperative  workup including clinical examination, x-rays and MRI scans indicating a torn rotator cuff as well as a likely SLAP tear and advanced AC arthritis with outlet impingement.  Having failed an extended period of conservative management, the patient desires  operative treatment to restore functional and eliminate pain.  Informed consent obtained.  DESCRIPTION OF PROCEDURE:  After an adequate level of anesthesia was achieved, the patient was positioned in  modified beach chair position.  Right shoulder correctly identified and sterilely prepped and draped in the usual manner.  Timeout called.  We  entered the patient's shoulder using standard arthroscopic portals including anterior, posterior and lateral portals.  We identified significant tearing of the superior labrum, biceps anchor.  This was completely unstable.  We performed a biceps tenotomy  and labral debridement.  The majority of the superior labrum was involved in the tear and was debrided.  This extended into the anterior labrum as well.  The posterior and posterior inferior labrum had some degenerative fraying, but no significant deep  tears that was just simply debrided in a shallow manner.  Articular cartilage with minimal chondromalacia, some grade II fibrillation.  The rotator cuff was clearly torn involving supraspinatus and infraspinatus tendons with some retraction.  The  subscapularis was intact and the teres minor was intact.  We then placed the scope in the subacromial space.  A thorough bursectomy and acromioplasty was performed, creating a type 1 acromial shaped with a butcher block technique utilizing a high speed  bur.  We had nice decompression of the rotator cuff outlet.  CA ligament was released.  AC joint had no cartilage and was clearly arthritic.  The rotator cuff was clearly torn from the bursal surface.  We concluded the arthroscopic portion of the  procedure made a small saber incision overlying the AC joint.  Dissection down through subcutaneous tissues using needle tip Bovie.  identified the deltotrapezial fascia incised in line with distal clavicle.  Subperiosteal dissection distal clavicle  performed followed by excision of distal 2 mm of bone using an oscillating saw.  We irrigated thoroughly the AC interval.  We also removed the dorsal osteophyte off the acromion and hypertrophied capsule.  We checked to make sure that the anterior and  posterior AC ligaments were  intact, which they were.  We applied bone wax to the cut end of the clavicle, irrigated thoroughly and then repaired the deltotrapezial fascia anatomically with 0 Vicryl suture followed by 2-0 Vicryl for subcutaneous closure  and 4-0 Monocryl for skin.  We then addressed the rotator cuff tear and the biceps tenodesis through a single mini open incision.  We started at the anterolateral border of the acromion extending distally about 4 cm in the raphae between the anterior and  lateral heads of the deltoid.  Skin incision with a 10 blade scalpel.  Dissection down through subcutaneous tissues with a needle tip Bovie.  Identified the deltoid fat stripe between the anterior and lateral heads and developed that with a needle tip  Bovie.  I placed Arthrex retractor, identified the biceps groove and delivered the biceps tendon out of the bicipital sheath.  We whipstitched with #2 Hi-Fi suture in the zone of the tenodesis with some multiple grasping suture technique.  We then  prepared the floor of the biceps groove.  The needle tip Bovie and a Freer elevator to get down to bleeding bone.  We then placed a Y-Knot flex anchor through the floor of the biceps groove and brought that suture up in a mattress fashion through the  reinforced portion of the tendon.  We took the whipstitch sutures out through the rotator interval and tied over a bridge of soft tissue in a mattress fashion incorporating some subscapularis.  So we had really good purchase without suture for the  longitudinal pole and also compression in the bicipital groove with the suture anchor suture.  We next directed our attention towards the multi-tendon rotator cuff tear involving supraspinatus and infraspinatus tendons.  There was about a centimeter  retraction.  We were able to mobilize with a #2 high Hi-Fi suture in simple suture fashion with 4 sutures in the free edge of the tendon.  We were able to mobilize that.  The tear started at the anterior  edge of the supraspinatus extending posteriorly  and then halfway through the infraspinatus it began to veer medially.  We went ahead and did an anchor stitch there at the posterior aspect of the tear, once we had everything mobilized on both sides with a Cobb.  We also prepared the greater tuberosity  with a rongeur and curet.  We placed 2 Y-Knot RC suture anchors at the articular margin of the greater tuberosity and brought the sutures up in a mattress fashion through the medial portion of the rotator cuff footprint.  The lateral sutures, we took  down through drill holes in the bone and tied over the lateral bone bridge of the greater tuberosity, so we had a double row repair, watertight.  Everything moved together as a unit as I ranged the shoulder.  This was an anatomic repair.  I was very  pleased with that.  We irrigated thoroughly and then repaired the deltoid anatomically with 0 Vicryl suture followed by 2-0 Vicryl for subcutaneous closure and 4-0 Monocryl for skin and portals.  Steri-Strips applied followed by sterile dressing.  The  patient tolerated surgery well.  TN/NUANCE  D:02/23/2018 T:02/23/2018 JOB:[id number]

## 2018-02-23 NOTE — Anesthesia Preprocedure Evaluation (Signed)
Anesthesia Evaluation  Patient identified by MRN, date of birth, ID band Patient awake    Reviewed: Allergy & Precautions, NPO status , Patient's Chart, lab work & pertinent test results  Airway Mallampati: II  TM Distance: >3 FB Neck ROM: Full    Dental  (+) Dental Advisory Given, Partial Upper   Pulmonary former smoker,    Pulmonary exam normal breath sounds clear to auscultation       Cardiovascular hypertension, Pt. on home beta blockers and Pt. on medications (-) angina+ CAD, + Past MI, + Cardiac Stents and + Peripheral Vascular Disease  Normal cardiovascular exam+ Cardiac Defibrillator  Rhythm:Regular Rate:Normal     Neuro/Psych negative neurological ROS  negative psych ROS   GI/Hepatic Neg liver ROS, GERD  Medicated,  Endo/Other  negative endocrine ROS  Renal/GU negative Renal ROS     Musculoskeletal negative musculoskeletal ROS (+)   Abdominal   Peds  Hematology  (+) Blood dyscrasia (Plavix), ,   Anesthesia Other Findings Day of surgery medications reviewed with the patient.  Reproductive/Obstetrics                             Anesthesia Physical Anesthesia Plan  ASA: IV  Anesthesia Plan: General   Post-op Pain Management:  Regional for Post-op pain   Induction: Intravenous  PONV Risk Score and Plan: 2 and Ondansetron, Midazolam and Dexamethasone  Airway Management Planned: Oral ETT  Additional Equipment: Arterial line  Intra-op Plan:   Post-operative Plan: Extubation in OR  Informed Consent: I have reviewed the patients History and Physical, chart, labs and discussed the procedure including the risks, benefits and alternatives for the proposed anesthesia with the patient or authorized representative who has indicated his/her understanding and acceptance.   Dental advisory given  Plan Discussed with: CRNA  Anesthesia Plan Comments: (Arterial line, etomidate for  induction.)        Anesthesia Quick Evaluation

## 2018-02-23 NOTE — Interval H&P Note (Signed)
History and Physical Interval Note:  02/23/2018 2:33 PM  Shawn Burke  has presented today for surgery, with the diagnosis of Right shoulder rotator cuff tear  The various methods of treatment have been discussed with the patient and family. After consideration of risks, benefits and other options for treatment, the patient has consented to  Procedure(s): RIGHT SHOULDER ARTHROSCOPY WITH OPEN ROTATOR CUFF REPAIR, SUBACROMIAL DECOMPRESSION, OPEN DISTAL CLAVICLE RESECTION, OPEN BICEP TENODESIS (Right) as a surgical intervention .  The patient's history has been reviewed, patient examined, no change in status, stable for surgery.  I have reviewed the patient's chart and labs.  Questions were answered to the patient's satisfaction.     Hester Joslin,STEVEN R

## 2018-02-26 ENCOUNTER — Encounter (HOSPITAL_COMMUNITY): Payer: Self-pay | Admitting: Orthopedic Surgery

## 2018-10-24 ENCOUNTER — Ambulatory Visit (INDEPENDENT_AMBULATORY_CARE_PROVIDER_SITE_OTHER): Payer: 59 | Admitting: Family Medicine

## 2018-10-24 ENCOUNTER — Encounter: Payer: Self-pay | Admitting: Family Medicine

## 2018-10-24 VITALS — BP 138/89 | HR 84 | Temp 98.1°F | Wt 209.8 lb

## 2018-10-24 DIAGNOSIS — R739 Hyperglycemia, unspecified: Secondary | ICD-10-CM

## 2018-10-24 DIAGNOSIS — I10 Essential (primary) hypertension: Secondary | ICD-10-CM | POA: Diagnosis not present

## 2018-10-24 DIAGNOSIS — J439 Emphysema, unspecified: Secondary | ICD-10-CM

## 2018-10-24 DIAGNOSIS — I252 Old myocardial infarction: Secondary | ICD-10-CM

## 2018-10-24 DIAGNOSIS — Z125 Encounter for screening for malignant neoplasm of prostate: Secondary | ICD-10-CM

## 2018-10-24 DIAGNOSIS — I2510 Atherosclerotic heart disease of native coronary artery without angina pectoris: Secondary | ICD-10-CM

## 2018-10-24 DIAGNOSIS — I5022 Chronic systolic (congestive) heart failure: Secondary | ICD-10-CM

## 2018-10-24 DIAGNOSIS — I255 Ischemic cardiomyopathy: Secondary | ICD-10-CM

## 2018-10-24 DIAGNOSIS — K219 Gastro-esophageal reflux disease without esophagitis: Secondary | ICD-10-CM

## 2018-10-24 DIAGNOSIS — Z95810 Presence of automatic (implantable) cardiac defibrillator: Secondary | ICD-10-CM

## 2018-10-24 DIAGNOSIS — E785 Hyperlipidemia, unspecified: Secondary | ICD-10-CM | POA: Insufficient documentation

## 2018-10-24 HISTORY — DX: Chronic systolic (congestive) heart failure: I50.22

## 2018-10-24 LAB — TESTOSTERONE,FREE AND TOTAL
Testosterone, Free: 13.3
Testosterone: 492

## 2018-10-24 MED ORDER — PANTOPRAZOLE SODIUM 40 MG PO TBEC: 40.0000 mg | DELAYED_RELEASE_TABLET | Freq: Every day | ORAL | 1 refills | Status: DC

## 2018-10-24 MED ORDER — FUROSEMIDE 20 MG PO TABS: 20.0000 mg | ORAL_TABLET | Freq: Every day | ORAL | 1 refills | Status: DC

## 2018-10-24 MED ORDER — ATORVASTATIN CALCIUM 80 MG PO TABS: 80.0000 mg | ORAL_TABLET | Freq: Every day | ORAL | 1 refills | Status: DC

## 2018-10-24 MED ORDER — CLOPIDOGREL BISULFATE 75 MG PO TABS: 75.0000 mg | ORAL_TABLET | Freq: Every day | ORAL | 1 refills | Status: DC

## 2018-10-24 MED ORDER — SPIRONOLACTONE 25 MG PO TABS: 25.0000 mg | ORAL_TABLET | Freq: Every day | ORAL | 1 refills | Status: DC

## 2018-10-24 MED ORDER — CARVEDILOL 6.25 MG PO TABS: 6.2500 mg | ORAL_TABLET | Freq: Two times a day (BID) | ORAL | 1 refills | Status: DC

## 2018-10-24 NOTE — Progress Notes (Signed)
Shawn Burke is a 48 y.o. male who presents to [facility] today for establish care.  Shawn Burke has a variety of complicated medical problems.  He had a significant myocardial infarction in 2018 resulted  ventricular fibrillation requiring defibrillation.  He survived this and now has systolic heart failure.  He was originally managed by cardiology at [facility] but has been somewhat lost to follow-up.  His last encounter with a medical provider was in September 2019 and his last encounter with a cardiologist was during his hospitalization.  He has been taking his medications but he notes that his medicines were changed and is not quite sure why.  He thinks his Coreg was increased incorrectly increased to 25 mg twice daily.  He notes this makes him very fatigued and he cannot take the full dose.  Additionally he notes that he is having significant heartburn on 20 mg of Protonix and does better if he takes 40 and sometimes takes Tums.  He has a significant issue is also right shoulder pain.  He suffered a rotator cuff tear in June and had surgery.  He had recheck MRI in February which looked pretty normal.  However he continues to have significant pain especially with forward flexion.  This is currently being evaluated by orthopedic surgeon as part of a Worker's Compensation or accident claim issue.   He also notes that he had a significant history of hypotension in September and some of his blood pressure medications were discontinued at that time.  He is not sure exactly what was stopped. ROS as above:  No headache, visual changes, nausea, vomiting, diarrhea, constipation, dizziness, abdominal pain, skin rash, fevers, chills, night sweats, weight loss, swollen lymph nodes, body aches, joint swelling, unexplained muscle aches, chest pain, shortness of breath, mood changes, visual or  auditory hallucinations.   Exam:  BP 138/89   Pulse 84   Temp 98.1 F (36.7 C) (Oral)   Wt 209 lb 12.8 oz (95.2 kg)   BMI 30.10 kg/m  Wt Readings from Last 5 Encounters:  10/24/18 209 lb 12.8 oz (95.2 kg)  02/23/18 188 lb (85.3 kg)    Gen: Well NAD nontoxic in no acute distress HEENT: EOMI,  MMM Lungs: Normal work of breathing. CTABL Heart: RRR no MRG Abd: NABS, Soft. Nondistended, Nontender Exts: Brisk capillary refill, warm and well perfused.  Right shoulder: Normal-appearing nontender.  Normal abduction external rotation.  Forward flexion significantly painful. Strength diminished abduction limited by pain 4/5.  Otherwise normal. Mildly positive Hawkins and Neer's test.  Lab and Radiology Results Radiology results included below.  Labs have been abstracted from [facility].   Assessment and Plan: 48 y.o. male with  Heart disease: Patient has a very complicated heart history.  Currently he does not have any care provided by a cardiologist.  As far as I can tell based on review of his chart he has systolic heart failure and is currently managed with furosemide, beta-blocker, spironolactone.  He does not currently have ACE inhibitor.  I suspect the ACE inhibitor was discontinued as result of hypotension.  Plan to correct his dose of Coreg back down to 6.25 mg twice daily which he was taking and doing well on.  Recheck in 1 month.  Check fasting labs listed below.  Acid reflux: Not well controlled.  Plan to increase Protonix to 40 mg daily.  Okay to take with Pepcid if needed.  Recheck in 1 month.  Shoulder pain: Unclear etiology.  Will stabilize patient and proceed with further evaluation of shoulder in 1 month if needed.  Dynamic ultrasonography should be helpful.  PDMP not reviewed this encounter. Orders Placed This Encounter  Procedures  . COMPLETE METABOLIC PANEL WITH GFR  . CBC  . LDL cholesterol, direct  . PSA  . Hemoglobin A1c  . CBC and differential    This external  order was created through the Results Console.  . Basic metabolic panel    This external order was created through the Results Console.  . Hepatic function panel    This external order was created through the Results Console.  . Hemoglobin A1c    This external order was created through the Results Console.  . Lipid panel    This external order was created through the Results Console.  . TSH    This external order was created through the Results Console.  Raynaldo Opitz and Total    This order was created through External Result Entry   Meds ordered this encounter  Medications  . pantoprazole (PROTONIX) 40 MG tablet    Sig: Take 1 tablet (40 mg total) by mouth daily. For acid reflux    Dispense:  90 tablet    Refill:  1  . spironolactone (ALDACTONE) 25 MG tablet    Sig: Take 1 tablet (25 mg total) by mouth daily. For heart    Dispense:  90 tablet    Refill:  1  . atorvastatin (LIPITOR) 80 MG tablet    Sig: Take 1 tablet (80 mg total) by mouth daily. For cholesterol    Dispense:  90 tablet    Refill:  1  . carvedilol (COREG) 6.25 MG tablet    Sig: Take 1 tablet (6.25 mg total) by mouth 2 (two) times daily. For heart and blood pressure    Dispense:  180 tablet    Refill:  1  . clopidogrel (PLAVIX) 75 MG tablet    Sig: Take 1 tablet (75 mg total) by mouth daily. For stents    Dispense:  90 tablet    Refill:  1  . furosemide (LASIX) 20 MG tablet    Sig: Take 1 tablet (20 mg total) by mouth daily. Fluid pill    Dispense:  90 tablet    Refill:  1     Historical information moved to improve visibility of documentation.  Past Medical History:  Diagnosis Date  . AICD (automatic cardioverter/defibrillator) present    [place]  . Chronic systolic heart failure ([place]) 10/24/2018  . Coronary artery disease    2017 - 1 stent. 5 in October 2018  . GERD (gastroesophageal reflux disease)   . Hypertension   . Myocardial infarction ([place])    x 2 in 02/2017  . Peripheral vascular  disease ([facility])    DVT in right leg  . Pneumonia    Past Surgical History:  Procedure Laterality Date  . CARDIAC CATHETERIZATION    . CHOLECYSTECTOMY    . CORONARY ANGIOPLASTY    . KNEE ARTHROSCOPY Right    x 2  . NASAL SEPTUM SURGERY      x 3  . SHOULDER ARTHROSCOPY Left   . SHOULDER ARTHROSCOPY WITH ROTATOR CUFF REPAIR AND SUBACROMIAL DECOMPRESSION Right 02/23/2018   Procedure: RIGHT SHOULDER ARTHROSCOPY WITH OPEN ROTATOR CUFF REPAIR, SUBACROMIAL DECOMPRESSION, OPEN DISTAL CLAVICLE RESECTION, OPEN BICEP TENODESIS, LABRAL DEBRIDEMENT;  Surgeon: Netta Cedars, MD;  Location: [facility];  Service: Orthopedics;  Laterality: Right;  .  WRIST SURGERY Right    Social History   Tobacco Use  . Smoking status: Former Smoker    Types: Cigarettes  . Smokeless tobacco: Current User    Types: Snuff  . Tobacco comment: quit 2-3 years ago  Substance Use Topics  . Alcohol use: Yes    Comment:  occasional   family history includes Heart attack in his father.  Medications: Current Outpatient Medications  Medication Sig Dispense Refill  . aspirin EC 81 MG tablet Take 81 mg by mouth daily.    Marland Kitchen atorvastatin (LIPITOR) 80 MG tablet Take 1 tablet (80 mg total) by mouth daily. For cholesterol 90 tablet 1  . carvedilol (COREG) 6.25 MG tablet Take 1 tablet (6.25 mg total) by mouth 2 (two) times daily. For heart and blood pressure 180 tablet 1  . clopidogrel (PLAVIX) 75 MG tablet Take 1 tablet (75 mg total) by mouth daily. For stents 90 tablet 1  . spironolactone (ALDACTONE) 25 MG tablet Take 1 tablet (25 mg total) by mouth daily. For heart 90 tablet 1  . furosemide (LASIX) 20 MG tablet Take 1 tablet (20 mg total) by mouth daily. Fluid pill 90 tablet 1  . GOODYS EXTRA STRENGTH 520-260-32.5 MG PACK Take 1 packet by mouth 3 (three) times daily as needed (for headaches/toothaches.).    Marland Kitchen pantoprazole (PROTONIX) 40 MG tablet Take 1 tablet (40 mg total) by mouth daily. For acid reflux 90 tablet 1   No current  facility-administered medications for this visit.    Allergies  Allergen Reactions  . Coconut Flavor Anaphylaxis, Shortness Of Breath and Swelling     Discussed warning signs or symptoms. Please see discharge instructions. Patient expresses understanding.  Appendix: Echocardiogram results from October 2018 Interpretation Summary 1. Left ventricle is normal in size with severely reduced function with an EF 25-30%. There is akinesis of the apex, mid/distal septum, and severe hypokinesis of the mid/distal anterior wall, and mid/distal inferior wall. Contrast echo was administered and no obvious LV thrombus. 2. Right ventricle is normal in size and function. Pacer/device lead is noted in right heart. 3. Trivial mitral regurgitation. 4. Atria are normal in size. 5. No pericardial effusion.  Cardiac catheterization from November 2018 Procedure Findings 1. Patent recent proximal RCA stent is widely patent with no concerns. 2. The distal rPDA is occluded; this was previously treated with angioplasty. 3. Residual proximal LAD has severe 80% narrowing.  Previous mid LAD stent is widely patent.  Beyond the stented segment has focal, eccentric 60-70% narrowed.    MRI SHOULDER WO CONTRAST RIGHT 11/14/2017 [facility] Result Impression  IMPRESSION: 1.  High-grade bursal surface tear of the anterior mid supraspinatus. 2.  Low-grade bursal surface tear of the anteriormost infraspinatus. 3.  Low-grade partial articular surface tear of the superiormost subscapularis. 4.  Mild acromioclavicular joint degenerative changes. Subacromial spurring and mild subacromial/subdeltoid bursitis. Findings may contribute to impingement. 5.  Motion limited examination.  Electronically Signed by: Salvadore Oxford   MRI SHOULDER WO CONTRAST RIGHT2/28/2020 [facility] Result Impression  IMPRESSION:  1. Supraspinatus tendon repair with no evidence of a high-grade tear retracted recurrent tear.  2. Biceps  tenodesis, mostly obscured by artifact. 3. Subacromial decompression with a small amount of nonspecific bursal fluid.   Electronically Signed by: Tonita Phoenix

## 2018-10-24 NOTE — Patient Instructions (Signed)
Thank you for coming in today. Continue medicine.  Reduce carvidiol dose to 6.25mg  twice daily.   Increase pantropoloze to 40mg  daily.  Recheck with me in 1 month.  If labs are ok and shoulder not better we can try an injection.   I think your breathing and fatigue is probably your heart but it could be your lungs.  If not getting better we can try an inhaler.

## 2018-10-25 LAB — LDL CHOLESTEROL, DIRECT: Direct LDL: 57 mg/dL (ref <100)

## 2018-10-25 LAB — COMPLETE METABOLIC PANEL WITH GFR
AG Ratio: 1.8 (calc) (ref 1.0–2.5)
ALT: 24 U/L (ref 9–46)
AST: 22 U/L (ref 10–35)
Albumin: 4.5 g/dL (ref 3.6–5.1)
Alkaline phosphatase (APISO): 73 U/L (ref 35–144)
BUN: 12 mg/dL (ref 7–25)
CO2: 26 mmol/L (ref 20–32)
Calcium: 10 mg/dL (ref 8.6–10.3)
Chloride: 103 mmol/L (ref 98–110)
Creat: 1 mg/dL (ref 0.70–1.33)
GFR, Est African American: 101 mL/min/{1.73_m2} (ref >=60)
GFR, Est Non African American: 87 mL/min/{1.73_m2} (ref >=60)
Globulin: 2.5 g/dL (calc) (ref 1.9–3.7)
Glucose, Bld: 86 mg/dL (ref 65–99)
Potassium: 3.9 mmol/L (ref 3.5–5.3)
Sodium: 139 mmol/L (ref 135–146)
Total Bilirubin: 0.5 mg/dL (ref 0.2–1.2)
Total Protein: 7 g/dL (ref 6.1–8.1)

## 2018-10-25 LAB — HEMOGLOBIN A1C
Hgb A1c MFr Bld: 5.3 % of total Hgb (ref <5.7)
Mean Plasma Glucose: 105 (calc)
eAG (mmol/L): 5.8 (calc)

## 2018-10-25 LAB — CBC
HCT: 45.3 % (ref 38.5–50.0)
Hemoglobin: 15.6 g/dL (ref 13.2–17.1)
MCH: 33.9 pg — ABNORMAL HIGH (ref 27.0–33.0)
MCHC: 34.4 g/dL (ref 32.0–36.0)
MCV: 98.5 fL (ref 80.0–100.0)
MPV: 10.5 fL (ref 7.5–12.5)
Platelets: 221 10*3/uL (ref 140–400)
RBC: 4.6 10*6/uL (ref 4.20–5.80)
RDW: 12.8 % (ref 11.0–15.0)
WBC: 7.7 10*3/uL (ref 3.8–10.8)

## 2018-10-25 LAB — PSA: PSA: 1.4 ng/mL (ref <=4.0)

## 2018-10-30 NOTE — H&P (Signed)
Patient's anticipated LOS is less than 2 midnights, meeting these requirements: - Younger than 38 - Lives within 1 hour of care - Has a competent adult at home to recover with post-op recover - NO history of  - Chronic pain requiring opiods  - Diabetes  - Coronary Artery Disease  - Heart failure  - Heart attack  - Stroke  - DVT/VTE  - Cardiac arrhythmia  - Respiratory Failure/COPD  - Renal failure  - Anemia  - Advanced Liver disease       Shawn Burke is an 48 y.o. male.    Chief Complaint: right shoulder pain  HPI: Pt is a 48 y.o. male complaining of right shoulder pain for multiple years. Pain had continually increased since the beginning. Patient had previous right shoulder scope and developed some stiffness after surgery. Pt has tried various conservative treatments which have failed to alleviate their symptoms, including injections and therapy. Various options are discussed with the patient. Risks, benefits and expectations were discussed with the patient. Patient understand the risks, benefits and expectations and wishes to proceed with surgery.   PCP:  Gregor Hams, MD  D/C Plans: Home  PMH: Past Medical History:  Diagnosis Date  . AICD (automatic cardioverter/defibrillator) present    [place]  . Chronic systolic heart failure ([place]) 10/24/2018  . Coronary artery disease    2017 - 1 stent. 5 in October 2018  . GERD (gastroesophageal reflux disease)   . Hypertension   . Myocardial infarction ([place])    x 2 in 02/2017  . Peripheral vascular disease ([facility])    DVT in right leg  . Pneumonia     PSH: Past Surgical History:  Procedure Laterality Date  . CARDIAC CATHETERIZATION    . CHOLECYSTECTOMY    . CORONARY ANGIOPLASTY    . KNEE ARTHROSCOPY Right    x 2  . NASAL SEPTUM SURGERY      x 3  . SHOULDER ARTHROSCOPY Left   . SHOULDER ARTHROSCOPY WITH ROTATOR CUFF REPAIR AND SUBACROMIAL DECOMPRESSION Right 02/23/2018   Procedure: RIGHT SHOULDER ARTHROSCOPY  WITH OPEN ROTATOR CUFF REPAIR, SUBACROMIAL DECOMPRESSION, OPEN DISTAL CLAVICLE RESECTION, OPEN BICEP TENODESIS, LABRAL DEBRIDEMENT;  Surgeon: Netta Cedars, MD;  Location: [facility];  Service: Orthopedics;  Laterality: Right;  . WRIST SURGERY Right     Social History:  reports that he has quit smoking. His smoking use included cigarettes. His smokeless tobacco use includes snuff. He reports current alcohol use. He reports previous drug use.  Allergies:  Allergies  Allergen Reactions  . Coconut Flavor Anaphylaxis, Shortness Of Breath and Swelling    Medications: No current facility-administered medications for this encounter.    Current Outpatient Medications  Medication Sig Dispense Refill  . aspirin EC 81 MG tablet Take 81 mg by mouth daily.    Marland Kitchen atorvastatin (LIPITOR) 80 MG tablet Take 1 tablet (80 mg total) by mouth daily. For cholesterol 90 tablet 1  . carvedilol (COREG) 6.25 MG tablet Take 1 tablet (6.25 mg total) by mouth 2 (two) times daily. For heart and blood pressure 180 tablet 1  . clopidogrel (PLAVIX) 75 MG tablet Take 1 tablet (75 mg total) by mouth daily. For stents 90 tablet 1  . furosemide (LASIX) 20 MG tablet Take 1 tablet (20 mg total) by mouth daily. Fluid pill 90 tablet 1  . GOODYS EXTRA STRENGTH 520-260-32.5 MG PACK Take 1 packet by mouth 3 (three) times daily as needed (for headaches/toothaches.).    Marland Kitchen pantoprazole (PROTONIX) 40  MG tablet Take 1 tablet (40 mg total) by mouth daily. For acid reflux 90 tablet 1  . spironolactone (ALDACTONE) 25 MG tablet Take 1 tablet (25 mg total) by mouth daily. For heart 90 tablet 1    No results found for this or any previous visit (from the past 48 hour(s)). No results found.  ROS: Pain with rom of the right upper extremity  Physical Exam: Alert and oriented 48 y.o. male in no acute distress Cranial nerves 2-12 intact Cervical spine: full rom with no tenderness, nv intact distally Chest: active breath sounds bilaterally, no  wheeze rhonchi or rales Heart: regular rate and rhythm, no murmur Abd: non tender non distended with active bowel sounds Hip is stable with rom  Right shoulder with mildly to moderately painful rom nv intact distally No rashes or edema distally  Assessment/Plan Assessment: right shoulder stiffness s/p surgery  Plan:  Patient will undergo a right shoulder capsular release by Dr. Veverly Fells at [facility]. Risks benefits and expectations were discussed with the patient. Patient understand risks, benefits and expectations and wishes to proceed. Preoperative templating of the joint replacement has been completed, documented, and submitted to the Operating Room personnel in order to optimize intra-operative equipment management.   Merla Riches PA-C, MPAS [facility] is now [employer] [address], [place], [place], [place] [address] Phone: [phone] [email or web address] Facebook  [employer]

## 2018-10-31 NOTE — Progress Notes (Signed)
Reviewed pt chart in epic for pre-op interview.  Noted has ICD with ef 25-30%. Per anesthesia guidelines for ambulatory surgery center pt is not a candidate for [facility].  Called and spoke w/ Caryl Pina, or scheduler for dr Veverly Fells, informed of this but she stated per pt has recently had seen his cardiologist at [facility] two and half months ago.  But in epic per pt pcp, dr Lynne Leader, visit note dated 10-24-2018 pt currently without cardiologist.  Reviewed chart w/ anesthesia, Konrad Felix PA,  Stated pt is not candidate for [facility] will need to be at main OR. Informed Caryl Pina of this, stated will move pt.

## 2018-10-31 NOTE — Patient Instructions (Addendum)
Cliffard Hair    Your procedure is scheduled on: 11-05-2018  Report to [facility] Main  Entrance  Report to admitting at 1030 AM   YOU NEED TO HAVE A COVID 19 TEST ON TODAY AFTER PRE OP. THIS TEST MUST BE DONE BEFORE SURGERY, COME TO [place] ENTRANCE.   Remember: BRUSH YOUR TEETH MORNING OF SURGERY AND RINSE YOUR MOUTH OUT, NO CHEWING GUM CANDY OR MINTS.    NO SOLID FOOD AFTER MIDNIGHT THE NIGHT PRIOR TO SURGERY. NOTHING BY MOUTH EXCEPT CLEAR LIQUIDS UNTIL 430 AM. FINISH PRE SURGERY ENSURE DRINK  AT 430 AM.     CLEAR LIQUID DIET   Foods Allowed                                                                     Foods Excluded  Coffee and tea, regular and decaf                             liquids that you cannot  Plain Jell-O in any flavor                                             see through such as: Fruit ices (not with fruit pulp)                                     milk, soups, orange juice  Iced Popsicles                                    All solid food Carbonated beverages, regular and diet                                    Cranberry, grape and apple juices Sports drinks like Gatorade Lightly seasoned clear broth or consume(fat free) Sugar, honey syrup  Sample Menu Breakfast                                Lunch                                     Supper Cranberry juice                    Beef broth                            Chicken broth Jell-O  Grape juice                           Apple juice Coffee or tea                        Jell-O                                      Popsicle                                                Coffee or tea                        Coffee or tea  _____________________________________________________________________    Call this number if you have problems the morning of surgery [phone]    Take these medicines the morning of surgery  with A SIP OF WATER:  ATORVASTATIN (LIPITOR), CARVEDILOL (COREG), PANTAPRAZOLE (PROTONIX)                                You may not have any metal on your body including hair pins and              piercings  Do not wear jewelry, make-up, lotions, powders or perfumes, deodorant             Do not wear nail polish.  Do not shave  48 hours prior to surgery.              Men may shave face and neck.   Do not bring valuables to the hospital. [place].  Contacts, dentures or bridgework may not be worn into surgery.  Leave suitcase in the car. After surgery it may be brought to your room.     Patients discharged the day of surgery will not be allowed to drive home. IF YOU ARE HAVING SURGERY AND GOING HOME THE SAME DAY, YOU MUST HAVE AN ADULT TO DRIVE YOU HOME AND BE WITH YOU FOR 24 HOURS. YOU MAY GO HOME BY TAXI OR UBER OR ORTHERWISE, BUT AN ADULT MUST ACCOMPANY YOU HOME AND STAY WITH YOU FOR 24 HOURS.  Name and phone number of your driver:[facility] CELL [phone]  Special Instructions: N/A              Please read over the following fact sheets you were given: _____________________________________________________________________             [facility] - Preparing for Surgery Before surgery, you can play an important role.  Because skin is not sterile, your skin needs to be as free of germs as possible.  You can reduce the number of germs on your skin by washing with CHG (chlorahexidine gluconate) soap before surgery.  CHG is an antiseptic cleaner which kills germs and bonds with the skin to continue killing germs even after washing. Please DO NOT use if you have an allergy to CHG or antibacterial soaps.  If your skin becomes reddened/irritated stop using the CHG and inform your  nurse when you arrive at Short Stay. Do not shave (including legs and underarms) for at least 48 hours prior to the first CHG shower.  You may shave your  face/neck. Please follow these instructions carefully:  1.  Shower with CHG Soap the night before surgery and the  morning of Surgery.  2.  If you choose to wash your hair, wash your hair first as usual with your  normal  shampoo.  3.  After you shampoo, rinse your hair and body thoroughly to remove the  shampoo.                           4.  Use CHG as you would any other liquid soap.  You can apply chg directly  to the skin and wash                       Gently with a scrungie or clean washcloth.  5.  Apply the CHG Soap to your body ONLY FROM THE NECK DOWN.   Do not use on face/ open                           Wound or open sores. Avoid contact with eyes, ears mouth and genitals (private parts).                       Wash face,  Genitals (private parts) with your normal soap.             6.  Wash thoroughly, paying special attention to the area where your surgery  will be performed.  7.  Thoroughly rinse your body with warm water from the neck down.  8.  DO NOT shower/wash with your normal soap after using and rinsing off  the CHG Soap.                9.  Pat yourself dry with a clean towel.            10.  Wear clean pajamas.            11.  Place clean sheets on your bed the night of your first shower and do not  sleep with pets. Day of Surgery : Do not apply any lotions/deodorants the morning of surgery.  Please wear clean clothes to the hospital/surgery center.  FAILURE TO FOLLOW THESE INSTRUCTIONS MAY RESULT IN THE CANCELLATION OF YOUR SURGERY PATIENT SIGNATURE_________________________________  NURSE SIGNATURE__________________________________  ________________________________________________________________________

## 2018-11-01 ENCOUNTER — Other Ambulatory Visit: Payer: Self-pay

## 2018-11-01 ENCOUNTER — Encounter (HOSPITAL_COMMUNITY)
Admission: RE | Admit: 2018-11-01 | Discharge: 2018-11-01 | Disposition: A | Discharge status: Home / Self Care | Source: Ambulatory Visit | Attending: Orthopedic Surgery | Admitting: Orthopedic Surgery

## 2018-11-01 ENCOUNTER — Other Ambulatory Visit (HOSPITAL_COMMUNITY)
Admission: RE | Admit: 2018-11-01 | Discharge: 2018-11-01 | Disposition: A | Discharge status: Home / Self Care | Source: Ambulatory Visit | Attending: Orthopedic Surgery | Admitting: Orthopedic Surgery

## 2018-11-01 ENCOUNTER — Encounter (HOSPITAL_COMMUNITY): Payer: Self-pay

## 2018-11-01 DIAGNOSIS — I110 Hypertensive heart disease with heart failure: Secondary | ICD-10-CM | POA: Diagnosis not present

## 2018-11-01 DIAGNOSIS — I252 Old myocardial infarction: Secondary | ICD-10-CM | POA: Diagnosis not present

## 2018-11-01 DIAGNOSIS — I739 Peripheral vascular disease, unspecified: Secondary | ICD-10-CM | POA: Insufficient documentation

## 2018-11-01 DIAGNOSIS — M25511 Pain in right shoulder: Secondary | ICD-10-CM | POA: Insufficient documentation

## 2018-11-01 DIAGNOSIS — I5022 Chronic systolic (congestive) heart failure: Secondary | ICD-10-CM | POA: Insufficient documentation

## 2018-11-01 DIAGNOSIS — K219 Gastro-esophageal reflux disease without esophagitis: Secondary | ICD-10-CM | POA: Insufficient documentation

## 2018-11-01 DIAGNOSIS — Z7982 Long term (current) use of aspirin: Secondary | ICD-10-CM | POA: Insufficient documentation

## 2018-11-01 DIAGNOSIS — Z79899 Other long term (current) drug therapy: Secondary | ICD-10-CM | POA: Insufficient documentation

## 2018-11-01 DIAGNOSIS — I2510 Atherosclerotic heart disease of native coronary artery without angina pectoris: Secondary | ICD-10-CM | POA: Diagnosis not present

## 2018-11-01 DIAGNOSIS — Z86718 Personal history of other venous thrombosis and embolism: Secondary | ICD-10-CM | POA: Insufficient documentation

## 2018-11-01 DIAGNOSIS — Z01818 Encounter for other preprocedural examination: Secondary | ICD-10-CM | POA: Insufficient documentation

## 2018-11-01 DIAGNOSIS — Z7901 Long term (current) use of anticoagulants: Secondary | ICD-10-CM | POA: Insufficient documentation

## 2018-11-01 DIAGNOSIS — Z95810 Presence of automatic (implantable) cardiac defibrillator: Secondary | ICD-10-CM | POA: Insufficient documentation

## 2018-11-01 DIAGNOSIS — Z1159 Encounter for screening for other viral diseases: Secondary | ICD-10-CM | POA: Insufficient documentation

## 2018-11-01 HISTORY — DX: Dyspnea, unspecified: R06.00

## 2018-11-01 LAB — CBC
HCT: 44.8 % (ref 39.0–52.0)
Hemoglobin: 14.7 g/dL (ref 13.0–17.0)
MCH: 32.9 pg (ref 26.0–34.0)
MCHC: 32.8 g/dL (ref 30.0–36.0)
MCV: 100.2 fL — ABNORMAL HIGH (ref 80.0–100.0)
Platelets: 236 10*3/uL (ref 150–400)
RBC: 4.47 MIL/uL (ref 4.22–5.81)
RDW: 12 % (ref 11.5–15.5)
WBC: 7.3 10*3/uL (ref 4.0–10.5)
nRBC: 0 % (ref 0.0–0.2)

## 2018-11-01 LAB — BASIC METABOLIC PANEL
Anion gap: 9 (ref 5–15)
BUN: 16 mg/dL (ref 6–20)
CO2: 24 mmol/L (ref 22–32)
Calcium: 9.1 mg/dL (ref 8.9–10.3)
Chloride: 106 mmol/L (ref 98–111)
Creatinine, Ser: 1.02 mg/dL (ref 0.61–1.24)
GFR calc Af Amer: >60 mL/min (ref >60)
GFR calc non Af Amer: >60 mL/min (ref >60)
Glucose, Bld: 92 mg/dL (ref 70–99)
Potassium: 3.8 mmol/L (ref 3.5–5.1)
Sodium: 139 mmol/L (ref 135–145)

## 2018-11-01 NOTE — Progress Notes (Signed)
PCP: evan core  CARDIOLOGIST:dr kroll  INFO IN Epic:Shawn Burke 10-24-18 epic/chart  INFO ON CHART:[facility] 01-26-18 [facility], ekg 04-10-17 [facility] echo 08-30-16 [facility] 01-26-18 and 04-06-17 icd check 01-02-18, myocardial perfusion 07-20-17 [facility], cardiac cath 03-31-17 [facility], icd orders on chart [facility] dr Trixie Rude will will out orders but will not sign them since he does not practice at [facility].no rep needed per dr Trixie Rude orders on chart  BLOOD THINNERS AND LAST DOSES:none ____________________________________  PATIENT SYMPTOMS AT TIME OF PREOP:none

## 2018-11-02 LAB — NOVEL CORONAVIRUS, NAA (HOSP ORDER, SEND-OUT TO REF LAB; TAT 18-24 HRS): SARS-CoV-2, NAA: NOT DETECTED

## 2018-11-02 NOTE — Anesthesia Preprocedure Evaluation (Addendum)
Anesthesia Evaluation  Patient identified by MRN, date of birth, ID band Patient awake    Reviewed: Allergy & Precautions, NPO status , Patient's Chart, lab work & pertinent test results  Airway Mallampati: II  TM Distance: >3 FB Neck ROM: Full    Dental no notable dental hx. (+) Missing, Dental Advisory Given,    Pulmonary neg pulmonary ROS, former smoker,    Pulmonary exam normal breath sounds clear to auscultation       Cardiovascular hypertension, + CAD, + Past MI, + Cardiac Stents and + DVT  Normal cardiovascular exam+ Cardiac Defibrillator  Rhythm:Regular Rate:Normal  Cardiac History Anterior STEMI s/p mLAD stent 04/25/16; inferior STEMI due to pRCA thrombus, s/p DES RCA and PTCA mPDA, residual 80-90% pLAD lesion, LHC complicated by peri-PCI VF arrest with 23 ICD shocks for both VF and afib 03/27/17; inferior STEMI due occluded small distal PDA, no PCI 03/31/17; s/p DES to oLAD and mLAD 04/10/17  RCA/PDA disease addressed 03/27/17 [facility]), but procedure complicated by VF arrest, so residual LAD disease not addressed until 04/10/17 ([place]) with PDA re-occluded by that time (03/31/17 [facility], no intervention).   EKG: 11/01/2018 Rate 70 bpm Normal sinus rhythm Left axis deviation Minimal voltage criteria for LVH, may be normal variant Inferior infarct , age undetermined Anteroseptal infarct , age undetermined T wave abnormality, consider lateral ischemia  CV: Nuclear stress test 07/20/17([facility]): Conclusions: 1. Myocardial perfusion stress study is abnormal as described below but there is no active ischemia. 2. There is a fixed, large size, severe grade perfusion defect involving the entire apical and periapical segments indicating prior myocardial infarct/scar. In addition, there is a predominantly fixed, large size, moderate grade perfusion defect involving the inferior and inferoseptal walls  indicating prior myocardial infarction/scar. No significant reversible perfusion defects suggesting no significant stress-induced myocardial ischemia. 3. Overall left ventricular global systolic function is moderately depressed. The left ventricular end-diastolic volume is 638 mL. The overall calculated left ventricular ejection fraction is 30%. Severe hypokinesis of the inferior, inferoseptal, and apical wall of the left ventricle. 4. High cardiovascular risk study. Degree of risk is primarily based on significantly reduced LVEF.  Cardiac cath/PCI 04/10/17([facility]): 1. LAD:  Ostial lesion: 75% stenosis. Balloon angioplasty. 3.75mm x 18 mm Xience Alpine DES placed. 0% residual stenosis with TIMI grade 3 flow (brisk flow).  Mid lesion: 75% stenosis. 2.24mm x 12 mm Xience Alpine DES placed. 0% residual stenosis with TIMI grade 3 flow (brisk flow). Distal lesion: 25%. 2. LCX: Mid lesion: 25% stenosis. Distal lesion: 25% stenosis. 3. OM1: Ostial lesion: 25% stenosis. 4. OM2: Ostial lesion: 50% stenosis. 5. RPDA: Mid lesion: 50% stenosis. 6. LV: Estimated EF 30%. Sever hypokinesis of the apical myocardium. 7. AV: There is no stenosis. 8. MV: There is no regurgitation. 9. 120/6. 9 mm Hg LVEDP, dP/dt=1208 mm Hg/s  Echo 03/27/17([facility]): Interpretation Summary 1. Left ventricle is normal in size with severely reduced function with an EF 25-30%. There is akinesis of the apex, mid/distal septum, and severe hypokinesis of the mid/distal anterior wall, and mid/distal inferior wall. Contrast echo was administered and no obvious LV thrombus. 2. Right ventricle is normal in size and function. Pacer/device lead is noted in right heart. 3. Trivial mitral regurgitation. 4. Atria are normal in size. 5. No pericardial effusion.   Neuro/Psych negative neurological ROS  negative psych ROS   GI/Hepatic Neg liver ROS, GERD  ,  Endo/Other  negative endocrine ROS   Renal/GU  negative Renal ROS  negative genitourinary   Musculoskeletal negative musculoskeletal ROS (+)   Abdominal   Peds negative pediatric ROS (+)  Hematology  (+) Blood dyscrasia (on plavix, last dose 10/30/18), ,   Anesthesia Other Findings   Reproductive/Obstetrics negative OB ROS                          Anesthesia Physical Anesthesia Plan  ASA: III  Anesthesia Plan: General and Regional   Post-op Pain Management:  Regional for Post-op pain   Induction: Intravenous  PONV Risk Score and Plan: 2 and Midazolam, Dexamethasone and Ondansetron  Airway Management Planned: Oral ETT  Additional Equipment: Arterial line  Intra-op Plan:   Post-operative Plan: Extubation in OR  Informed Consent: I have reviewed the patients History and Physical, chart, labs and discussed the procedure including the risks, benefits and alternatives for the proposed anesthesia with the patient or authorized representative who has indicated his/her understanding and acceptance.     Dental advisory given  Plan Discussed with: CRNA  Anesthesia Plan Comments:        Anesthesia Quick Evaluation

## 2018-11-02 NOTE — Progress Notes (Signed)
Attempt prescreening call, no answer, left message on answering machine to call Short stay.

## 2018-11-02 NOTE — Progress Notes (Signed)
Anesthesia Chart Review   Case:  [id number] Date/Time:  11/05/18 1215   Procedure:  SHOULDER ARTHROSCOPY  capsular release and possible foreign body removal (Right )   Anesthesia type:  General   Pre-op diagnosis:  Right shoulder pain and stiffness   Location:  [facility] ROOM 07 / [facility]   Surgeon:  Netta Cedars, MD      DISCUSSION:Patient is a 48 year old male scheduled for the above procedure.  S/p right shoulder arthroscopy with open rotator cuff repair 02/24/28.  No anesthesia complications noted.  Pt is returning for shoulder arthroscopy, capsular release, and possible foreign body removal 11/05/2018.    Prior to last procedure anesthesia reviewed.  Per Myra Gianotti, PA-C,   "History includes former smoker, CAD/MI (anterior STEMI s/p mLAD stent 04/25/16; inferior STEMI due to pRCA thrombus, s/p DES RCA and PTCA mPDA, residual 80-90% pLAD lesion, LHC complicated by peri-PCI VF arrest with 23 ICD shocks for both VF and afib 03/27/17; inferior STEMI due occluded small distal PDA, no PCI 03/31/17; s/p DES to oLAD and mLAD 04/10/17), ischemic cardiomyopathy, ICD (per EP nurse Juliann Pulse at Dr. Hassie Bruce office: "Biotronik Inventra 7VR-T DX single lead RV ICD, serial number [id number]", implanted 09/23/16, left sided; interrogation: remote 01/02/18, office 07/05/17, "underlying sinus rhythm, 0% V-pacing"; PAF noted on 03/30/17 interrogation, afib burden 0.1%), HTN, RLE DVT ~ 2011, GERD.   RCA/PDA disease addressed 03/27/17 [facility]), but procedure complicated by VF arrest, so residual LAD disease not addressed until 04/10/17 [facility]) with PDA re-occluded by that time (03/31/17 [facility], no intervention). He had a high risk stress test 06/2017, but primarily due to low EF as there was no significant ischemia. Last EP cardiology visit 01/26/18 and no additional testing recommended at that time (see below).  - PCP Toma Deiters, FNP cleared for surgery at "moderate risk." - EP cardiologist Dr. Domenica Fail  cleared for surgery at "moderate risk." He gave permission to hold Plavix x5 days.  Reviewed with anesthesiologist Suella Broad, MD earlier today while awaiting [place] records. Fortunately received records since then confirming that LAD disease has also been addressed. Dr. Hassie Bruce staff would not complete the [place] cardiac device perioperative form due to being outside of their facility but did fax general Biotron[employer] recommendations (on chart)."  Since surgery he has established care with PCP, Dr. Lynne Leader, seen 10/24/2018.  Follow up scheduled 11/21/2018.  Pt has been stable since last surgery.  Last dose of Plavix 10/30/2018.  VS: BP 125/84   Pulse 71   Temp 36.6 C (Oral)   Ht 5\' 10"  (1.778 m)   Wt 90.7 kg   SpO2 96%   BMI 28.70 kg/m   PROVIDERS: Gregor Hams, MD  - Toma Deiters, FNP is PCP ([place]; see Care Everywhere). Last visit 02/14/18. Aware of surgery plans. Lisinopril held due to hypotension and call made to cardiology to discuss.   Wende Crease, MD is primary ardiologist at [facility] and Vascular and EP cardiologist is Joylene John, MD. Both are located at [facility] [phone]). Last seen by Dr. Domenica Fail on 01/26/18 for follow-up and preoperative evaluation. (Office note is on chart for review as needed.)  LABS: Labs reviewed: Acceptable for surgery. (all labs ordered are listed, but only abnormal results are displayed)  Labs Reviewed  CBC - Abnormal; Notable for the following components:      Result Value   MCV 100.2 (*)    All other  components within normal limits  BASIC METABOLIC PANEL     IMAGES:   EKG: 11/01/2018 Rate 70 bpm Normal sinus rhythm Left axis deviation Minimal voltage criteria for LVH, may be normal variant Inferior infarct , age undetermined Anteroseptal infarct , age undetermined T wave abnormality, consider lateral ischemia  CV: Nuclear stress test 07/20/17 ([place]): Conclusions: 1. Myocardial perfusion stress study is abnormal as described below but there is no active ischemia. 2. There is a fixed, large size, severe grade perfusion defect involving the entire apical and periapical segments indicating prior myocardial infarct/scar. In addition, there is a predominantly fixed, large size, moderate grade perfusion defect involving the inferior and inferoseptal walls indicating prior myocardial infarction/scar. No significant reversible perfusion defects suggesting no significant stress-induced myocardial ischemia. 3. Overall left ventricular global systolic function is moderately depressed. The left ventricular end-diastolic volume is 540 mL. The overall calculated left ventricular ejection fraction is 30%. Severe hypokinesis of the inferior, inferoseptal, and apical wall of the left ventricle. 4. High cardiovascular risk study. Degree of risk is primarily based on significantly reduced LVEF.  Cardiac cath/PCI 04/10/17 ([place]): Summary: 1. LAD:  Ostial lesion: 75% stenosis. Balloon angioplasty. 3.90mm x 18 mm Xience Alpine DES placed. 0% residual stenosis with TIMI grade 3 flow (brisk flow).  Mid lesion: 75% stenosis. 2.43mm x 12 mm Xience Alpine DES placed. 0% residual stenosis with TIMI grade 3 flow (brisk flow). Distal lesion: 25%. 2. LCX: Mid lesion: 25% stenosis. Distal lesion: 25% stenosis. 3. OM1: Ostial lesion: 25% stenosis. 4. OM2: Ostial lesion: 50% stenosis. 5. RPDA: Mid lesion: 50% stenosis. 6. LV: Estimated EF 30%. Sever hypokinesis of the apical myocardium. 7. AV: There is no stenosis. 8. MV: There is no regurgitation. 9. 120/6. 9 mm Hg LVEDP, dP/dt=1208 mm Hg/s  Cardiac cath 03/31/17 [facility]; [place]):  Procedure Findings 1. Patent recent proximal RCA stent is widely patent with no concerns. 2. The distal rPDA is occluded; this was previously treated with angioplasty. 3. Residual proximal  LAD has severe 80% narrowing.Previous mid LAD stent is widely patent.Beyond the stented segment has focal, eccentric 60-70% narrowed. Plan: culprit for patient's post AMI pain is small distal PDA vessel that was previous treated with only angioplasty; this small branch is now occluded.Repeat PCI not performed. Proximal stent is widely patent with no concerns. Medical therapy.  Cardiac cath 03/27/17 ([place]): SUMMARY: --HEMODYNAMICS: --Hemodynamic assessment demonstrates mildly to moderately elevated LVEDP. --CORONARY CIRCULATION: --Left main: The vessel was large sized. Angiography showed moderate atherosclerosis.  --Proximal LAD: The vessel was large sized. Angiography showed severe atherosclerosis. There was a tubular 90 % stenosis.  --Mid LAD: The vessel was large sized. Angiography showed severe atherosclerosis. There  was a discrete 70 % stenosis. -- Proximal circumflex: The vessel was medium to large sized. Angiography showed moderate atherosclerosis.  --Mid circumflex: The vessel was large sized. Angiography showed moderate atherosclerosis. --3rd obtuse marginal: The vessel was medium sized. Angiography showed moderate atherosclerosis.  --Proximal RCA: The vessel was large sized (dominant). Angiography showed severe atherosclerosis. There was a discrete 99 % stenosis. The lesion was was associated with a large filling defect consistent with thrombus. There was TIMI grade 2 flow through the vessel (partial perfusion). This lesion is a likely culprit for the patient's recent myocardial infarction. It appears amenable to percutaneous intervention.  --Right PDA: The vessel was medium to large sized. Angiography showed severe atherosclerosis. There was a discrete 90 % stenosis. -- INTERVENTIONS/PCI: --A  successful stent was performed on the 99 % lesion in the proximal RCA. Following intervention there was an excellent angiographic appearance with  a 0 % residual stenosis. --A balloon angioplasty was performed on the 90 % lesion in the right posterior descending artery. Following intervention there was an improved angiographic appearance with a 50 % residual stenosis. -- SUMMARY: --Acute Inferior STEMI with 99% proximal RCA lesion with heavy thrombus culprit for MI, 90% proximal LAD, 90% RPDA and moderate LCX and LM stenoses. Successful PCI to proximal RCA with placement of a 3.5 X 28 mm Synergy DES and PTCA to the mid PDA lesion. Patient did well but went into VF arrest upon moving from cath table to bed, he was resucitated and intubated. Admitted to CCU.  Echo 03/27/17 ([place]): Interpretation Summary 1. Left ventricle is normal in size with severely reduced function with an EF 25-30%. There is akinesis of the apex, mid/distal septum, and severe hypokinesis of the mid/distal anterior wall, and mid/distal inferior wall. Contrast echo was administered and no obvious LV thrombus. 2. Right ventricle is normal in size and function. Pacer/device lead is noted in right heart. 3. Trivial mitral regurgitation. 4. Atria are normal in size. 5. No pericardial effusion. Past Medical History:  Diagnosis Date  . AICD (automatic cardioverter/defibrillator) present 2018   B[employer]ronik  . Chronic systolic heart failure ([place]) 10/24/2018  . Coronary artery disease    2017 - 1 stent. 5 in October 2018  . Dyspnea    WITH EXERTION  . GERD (gastroesophageal reflux disease)   . Hypertension   . Myocardial infarction ([place])     X 1 2017x 2 in 02/2017  . Peripheral vascular disease ([place]) 20 YRS AGO   DVT in right leg    Past Surgical History:  Procedure Laterality Date  . CARDIAC CATHETERIZATION    . CHOLECYSTECTOMY    . CORONARY ANGIOPLASTY    . ICD IMPLANT  2018   BIOTRONIK  . KNEE ARTHROSCOPY Right    x 2  . NASAL SEPTUM SURGERY      x 3  . SHOULDER ARTHROSCOPY Left   . SHOULDER ARTHROSCOPY WITH ROTATOR CUFF REPAIR  AND SUBACROMIAL DECOMPRESSION Right 02/23/2018   Procedure: RIGHT SHOULDER ARTHROSCOPY WITH OPEN ROTATOR CUFF REPAIR, SUBACROMIAL DECOMPRESSION, OPEN DISTAL CLAVICLE RESECTION, OPEN BICEP TENODESIS, LABRAL DEBRIDEMENT;  Surgeon: Netta Cedars, MD;  Location: [place];  Service: Orthopedics;  Laterality: Right;  . STENTS TO HEART X 6  1 X 2017 5 IN 2018  . WRIST SURGERY Right     MEDICATIONS: . aspirin EC 81 MG tablet  . atorvastatin (LIPITOR) 80 MG tablet  . carvedilol (COREG) 6.25 MG tablet  . clopidogrel (PLAVIX) 75 MG tablet  . furosemide (LASIX) 20 MG tablet  . GOODYS EXTRA STRENGTH 520-260-32.5 MG PACK  . nitroGLYCERIN (NITROSTAT) 0.4 MG SL tablet  . pantoprazole (PROTONIX) 40 MG tablet  . spironolactone (ALDACTONE) 25 MG tablet   No current facility-administered medications for this encounter.    Maia Plan [facility] Pre-Surgical Testing [phone] 11/02/18 12:18 PM

## 2018-11-02 NOTE — Progress Notes (Signed)
SPOKE W/  _patient called back     SCREENING SYMPTOMS OF COVID 19:completed,  denies all symptoms   COUGH--no  RUNNY NOSE--- no  SORE THROAT---no  NASAL CONGESTION----no  SNEEZING--no--  SHORTNESS OF BREATH---no  DIFFICULTY BREATHING---no  TEMP >100.0 -----no  UNEXPLAINED BODY ACHES------no  CHILLS -----no---   HEADACHES ------no---  LOSS OF SMELL/ TASTE -----no---    HAVE YOU OR ANY FAMILY MEMBER TRAVELLED PAST 14 DAYS OUT OF THE   COUNTY---no STATE----no COUNTRY----no  HAVE YOU OR ANY FAMILY MEMBER BEEN EXPOSED TO ANYONE WITH COVID 19? no

## 2018-11-05 ENCOUNTER — Encounter (HOSPITAL_COMMUNITY)
Admission: RE | Disposition: A | Discharge status: Home / Self Care | Payer: Self-pay | Source: Other Acute Inpatient Hospital | Attending: Orthopedic Surgery

## 2018-11-05 ENCOUNTER — Encounter (HOSPITAL_COMMUNITY): Payer: Self-pay | Admitting: *Deleted

## 2018-11-05 ENCOUNTER — Ambulatory Visit (HOSPITAL_COMMUNITY): Admitting: Physician Assistant

## 2018-11-05 ENCOUNTER — Ambulatory Visit (HOSPITAL_COMMUNITY): Admitting: Certified Registered Nurse Anesthetist

## 2018-11-05 ENCOUNTER — Ambulatory Visit (HOSPITAL_COMMUNITY)
Admission: RE | Admit: 2018-11-05 | Discharge: 2018-11-05 | Disposition: A | Discharge status: Home / Self Care | Source: Other Acute Inpatient Hospital | Attending: Orthopedic Surgery | Admitting: Orthopedic Surgery

## 2018-11-05 DIAGNOSIS — Z87891 Personal history of nicotine dependence: Secondary | ICD-10-CM | POA: Insufficient documentation

## 2018-11-05 DIAGNOSIS — Z87898 Personal history of other specified conditions: Secondary | ICD-10-CM | POA: Insufficient documentation

## 2018-11-05 DIAGNOSIS — I2510 Atherosclerotic heart disease of native coronary artery without angina pectoris: Secondary | ICD-10-CM | POA: Insufficient documentation

## 2018-11-05 DIAGNOSIS — M9689 Other intraoperative and postprocedural complications and disorders of the musculoskeletal system: Secondary | ICD-10-CM | POA: Diagnosis not present

## 2018-11-05 DIAGNOSIS — I110 Hypertensive heart disease with heart failure: Secondary | ICD-10-CM | POA: Diagnosis not present

## 2018-11-05 DIAGNOSIS — Z7982 Long term (current) use of aspirin: Secondary | ICD-10-CM | POA: Insufficient documentation

## 2018-11-05 DIAGNOSIS — I5022 Chronic systolic (congestive) heart failure: Secondary | ICD-10-CM | POA: Insufficient documentation

## 2018-11-05 DIAGNOSIS — K219 Gastro-esophageal reflux disease without esophagitis: Secondary | ICD-10-CM | POA: Diagnosis not present

## 2018-11-05 DIAGNOSIS — M795 Residual foreign body in soft tissue: Secondary | ICD-10-CM | POA: Insufficient documentation

## 2018-11-05 DIAGNOSIS — Z9049 Acquired absence of other specified parts of digestive tract: Secondary | ICD-10-CM | POA: Diagnosis not present

## 2018-11-05 DIAGNOSIS — I252 Old myocardial infarction: Secondary | ICD-10-CM | POA: Diagnosis not present

## 2018-11-05 DIAGNOSIS — Y838 Other surgical procedures as the cause of abnormal reaction of the patient, or of later complication, without mention of misadventure at the time of the procedure: Secondary | ICD-10-CM | POA: Diagnosis not present

## 2018-11-05 DIAGNOSIS — Z79899 Other long term (current) drug therapy: Secondary | ICD-10-CM | POA: Diagnosis not present

## 2018-11-05 DIAGNOSIS — Z91018 Allergy to other foods: Secondary | ICD-10-CM | POA: Insufficient documentation

## 2018-11-05 HISTORY — PX: SHOULDER ARTHROSCOPY WITH ROTATOR CUFF REPAIR: SHX5685

## 2018-11-05 SURGERY — ARTHROSCOPY, SHOULDER, WITH ROTATOR CUFF REPAIR
Anesthesia: Regional | Laterality: Right

## 2018-11-05 MED ORDER — LIDOCAINE 2% (20 MG/ML) 5 ML SYRINGE
INTRAMUSCULAR | Status: AC
  Filled 2018-11-05: qty 5

## 2018-11-05 MED ORDER — SUCCINYLCHOLINE CHLORIDE 200 MG/10ML IV SOSY
PREFILLED_SYRINGE | INTRAVENOUS | Status: DC | PRN
  Administered 2018-11-05: 180 mg via INTRAVENOUS

## 2018-11-05 MED ORDER — LIDOCAINE 2% (20 MG/ML) 5 ML SYRINGE
INTRAMUSCULAR | Status: DC | PRN
  Administered 2018-11-05: 90 mg via INTRAVENOUS

## 2018-11-05 MED ORDER — ONDANSETRON HCL 4 MG/2ML IJ SOLN
INTRAMUSCULAR | Status: AC
  Filled 2018-11-05: qty 2

## 2018-11-05 MED ORDER — MIDAZOLAM HCL 2 MG/2ML IJ SOLN
INTRAMUSCULAR | Status: AC
  Administered 2018-11-05: 2 mg via INTRAVENOUS
  Filled 2018-11-05: qty 2

## 2018-11-05 MED ORDER — DEXAMETHASONE SODIUM PHOSPHATE 10 MG/ML IJ SOLN
INTRAMUSCULAR | Status: AC
  Filled 2018-11-05: qty 1

## 2018-11-05 MED ORDER — FENTANYL CITRATE (PF) 100 MCG/2ML IJ SOLN
INTRAMUSCULAR | Status: AC
  Filled 2018-11-05: qty 2

## 2018-11-05 MED ORDER — METHOCARBAMOL 500 MG PO TABS: 500.0000 mg | ORAL_TABLET | Freq: Three times a day (TID) | ORAL | 1 refills | Status: DC | PRN

## 2018-11-05 MED ORDER — MIDAZOLAM HCL 2 MG/2ML IJ SOLN
INTRAMUSCULAR | Status: AC
  Filled 2018-11-05: qty 2

## 2018-11-05 MED ORDER — SODIUM CHLORIDE 0.9 % IV SOLN
INTRAVENOUS | Status: DC | PRN
  Administered 2018-11-05: 40 ug/min via INTRAVENOUS

## 2018-11-05 MED ORDER — CHLORHEXIDINE GLUCONATE 4 % EX LIQD: 60.0000 mL | Freq: Once | CUTANEOUS | Status: DC

## 2018-11-05 MED ORDER — FENTANYL CITRATE (PF) 100 MCG/2ML IJ SOLN: 25.0000 ug | INTRAMUSCULAR | Status: DC | PRN

## 2018-11-05 MED ORDER — FENTANYL CITRATE (PF) 100 MCG/2ML IJ SOLN
INTRAMUSCULAR | Status: AC
  Administered 2018-11-05: 50 ug via INTRAVENOUS
  Filled 2018-11-05: qty 2

## 2018-11-05 MED ORDER — MIDAZOLAM HCL 2 MG/2ML IJ SOLN
1.0000 mg | Freq: Once | INTRAMUSCULAR | Status: AC
  Administered 2018-11-05: 12:00:00 2 mg via INTRAVENOUS

## 2018-11-05 MED ORDER — 0.9 % SODIUM CHLORIDE (POUR BTL) OPTIME
TOPICAL | Status: DC | PRN
  Administered 2018-11-05: 1000 mL

## 2018-11-05 MED ORDER — LACTATED RINGERS IR SOLN
Status: DC | PRN
  Administered 2018-11-05: 6000 mL

## 2018-11-05 MED ORDER — ACETAMINOPHEN 500 MG PO TABS
1000.0000 mg | ORAL_TABLET | Freq: Once | ORAL | Status: AC
  Administered 2018-11-05: 1000 mg via ORAL
  Filled 2018-11-05: qty 2

## 2018-11-05 MED ORDER — ONDANSETRON HCL 4 MG/2ML IJ SOLN
INTRAMUSCULAR | Status: DC | PRN
  Administered 2018-11-05: 4 mg via INTRAVENOUS

## 2018-11-05 MED ORDER — BUPIVACAINE-EPINEPHRINE 0.25% -1:200000 IJ SOLN
INTRAMUSCULAR | Status: DC | PRN
  Administered 2018-11-05: 5 mL

## 2018-11-05 MED ORDER — EPINEPHRINE PF 1 MG/ML IJ SOLN
INTRAMUSCULAR | Status: AC
  Filled 2018-11-05: qty 2

## 2018-11-05 MED ORDER — BUPIVACAINE-EPINEPHRINE (PF) 0.25% -1:200000 IJ SOLN
INTRAMUSCULAR | Status: AC
  Filled 2018-11-05: qty 30

## 2018-11-05 MED ORDER — DEXAMETHASONE SODIUM PHOSPHATE 10 MG/ML IJ SOLN
INTRAMUSCULAR | Status: DC | PRN
  Administered 2018-11-05: 10 mg via INTRAVENOUS

## 2018-11-05 MED ORDER — ETOMIDATE 2 MG/ML IV SOLN
INTRAVENOUS | Status: DC | PRN
  Administered 2018-11-05: 20 mg via INTRAVENOUS

## 2018-11-05 MED ORDER — CEFAZOLIN SODIUM-DEXTROSE 2-4 GM/100ML-% IV SOLN
2.0000 g | INTRAVENOUS | Status: AC
  Administered 2018-11-05: 2 g via INTRAVENOUS
  Filled 2018-11-05: qty 100

## 2018-11-05 MED ORDER — PROPOFOL 10 MG/ML IV BOLUS
INTRAVENOUS | Status: AC
  Filled 2018-11-05: qty 20

## 2018-11-05 MED ORDER — BUPIVACAINE LIPOSOME 1.3 % IJ SUSP
INTRAMUSCULAR | Status: DC | PRN
  Administered 2018-11-05: 10 mL via PERINEURAL

## 2018-11-05 MED ORDER — ETOMIDATE 2 MG/ML IV SOLN
INTRAVENOUS | Status: AC
  Filled 2018-11-05: qty 10

## 2018-11-05 MED ORDER — LACTATED RINGERS IV SOLN
INTRAVENOUS | Status: DC
  Administered 2018-11-05: 11:00:00 via INTRAVENOUS

## 2018-11-05 MED ORDER — BUPIVACAINE HCL (PF) 0.5 % IJ SOLN
INTRAMUSCULAR | Status: DC | PRN
  Administered 2018-11-05: 15 mL via PERINEURAL

## 2018-11-05 MED ORDER — FENTANYL CITRATE (PF) 100 MCG/2ML IJ SOLN
INTRAMUSCULAR | Status: DC | PRN
  Administered 2018-11-05 (×2): 50 ug via INTRAVENOUS

## 2018-11-05 MED ORDER — SUCCINYLCHOLINE CHLORIDE 200 MG/10ML IV SOSY
PREFILLED_SYRINGE | INTRAVENOUS | Status: AC
  Filled 2018-11-05: qty 10

## 2018-11-05 MED ORDER — OXYCODONE-ACETAMINOPHEN 5-325 MG PO TABS: 1.0000 | ORAL_TABLET | ORAL | 0 refills | Status: DC | PRN

## 2018-11-05 MED ORDER — SUGAMMADEX SODIUM 200 MG/2ML IV SOLN
INTRAVENOUS | Status: AC
  Filled 2018-11-05: qty 2

## 2018-11-05 MED ORDER — FENTANYL CITRATE (PF) 100 MCG/2ML IJ SOLN
50.0000 ug | Freq: Once | INTRAMUSCULAR | Status: AC
  Administered 2018-11-05: 50 ug via INTRAVENOUS

## 2018-11-05 SURGICAL SUPPLY — 37 items
BLADE 4.2CUDA (BLADE) IMPLANT
BLADE SABRETOOTH 4.0X13 (MISCELLANEOUS) ×2 IMPLANT
BLADE SURG SZ10 CARB STEEL (BLADE) ×2 IMPLANT
BLADE SURG SZ11 CARB STEEL (BLADE) ×2 IMPLANT
BOOTIES KNEE HIGH SLOAN (MISCELLANEOUS) ×4 IMPLANT
BUR OVAL 4.0 (BURR) IMPLANT
COVER SURGICAL LIGHT HANDLE (MISCELLANEOUS) ×2 IMPLANT
COVER WAND RF STERILE (DRAPES) IMPLANT
DECANTER SPIKE VIAL GLASS SM (MISCELLANEOUS) ×2 IMPLANT
DRAPE SHOULDER BEACH CHAIR (DRAPES) ×2 IMPLANT
DRAPE STERI 35X30 U-POUCH (DRAPES) ×2 IMPLANT
DRAPE U-SHAPE 47X51 STRL (DRAPES) ×2 IMPLANT
DRSG EMULSION OIL 3X3 NADH (GAUZE/BANDAGES/DRESSINGS) ×2 IMPLANT
DRSG PAD ABDOMINAL 8X10 ST (GAUZE/BANDAGES/DRESSINGS) ×4 IMPLANT
DURAPREP 26ML APPLICATOR (WOUND CARE) ×2 IMPLANT
GLOVE ORTHO TXT STRL SZ7.5 (GLOVE) ×2 IMPLANT
GLOVE SURG ORTHO 8.5 STRL (GLOVE) ×2 IMPLANT
GOWN STRL REUS W/TWL LRG LVL3 (GOWN DISPOSABLE) ×4 IMPLANT
KIT BASIN OR (CUSTOM PROCEDURE TRAY) ×4 IMPLANT
KIT TURNOVER KIT A (KITS) IMPLANT
MANIFOLD NEPTUNE II (INSTRUMENTS) ×4 IMPLANT
NEEDLE SPNL 18GX3.5 QUINCKE PK (NEEDLE) ×2 IMPLANT
PACK SHOULDER (CUSTOM PROCEDURE TRAY) ×2 IMPLANT
PORT APPOLLO RF 90DEGREE MULTI (SURGICAL WAND) ×2 IMPLANT
PROTECTOR NERVE ULNAR (MISCELLANEOUS) ×2 IMPLANT
RESTRAINT HEAD UNIVERSAL NS (MISCELLANEOUS) ×2 IMPLANT
SLING ARM IMMOBILIZER LRG (SOFTGOODS) IMPLANT
SLING ARM IMMOBILIZER MED (SOFTGOODS) ×2 IMPLANT
SLING ARM IMMOBILIZER XL (CAST SUPPLIES) ×2 IMPLANT
STRIP CLOSURE SKIN 1/4X4 (GAUZE/BANDAGES/DRESSINGS) ×2 IMPLANT
SUT ETHILON 4 0 PS 2 18 (SUTURE) ×2 IMPLANT
SYR BULB IRRIGATION 50ML (SYRINGE) ×2 IMPLANT
TAPE CLOTH SURG 4X10 WHT LF (GAUZE/BANDAGES/DRESSINGS) ×2 IMPLANT
TAPE STRIPS DRAPE STRL (GAUZE/BANDAGES/DRESSINGS) ×2 IMPLANT
TOWEL OR 17X26 10 PK STRL BLUE (TOWEL DISPOSABLE) ×4 IMPLANT
TUBING ARTHRO INFLOW-ONLY STRL (TUBING) ×2 IMPLANT
TUBING CONNECTING 10 (TUBING) ×2 IMPLANT

## 2018-11-05 NOTE — Anesthesia Postprocedure Evaluation (Signed)
Anesthesia Post Note  Patient: Shawn Burke  Procedure(s) Performed: SHOULDER ARTHROSCOPY   foreign body removal (Right )     Patient location during evaluation: PACU Anesthesia Type: Regional and General Level of consciousness: awake and alert Pain management: pain level controlled Vital Signs Assessment: post-procedure vital signs reviewed and stable Respiratory status: spontaneous breathing, nonlabored ventilation, respiratory function stable and patient connected to nasal cannula oxygen Cardiovascular status: blood pressure returned to baseline and stable Postop Assessment: no apparent nausea or vomiting Anesthetic complications: no    Last Vitals:  Vitals:   11/05/18 1530 11/05/18 1545  BP: 123/81 (!) 154/92  Pulse: 69 70  Resp: 19 18  Temp:    SpO2: 96% 94%    Last Pain:  Vitals:   11/05/18 1530  TempSrc:   PainSc: 0-No pain                 Latravis Grine L Ireene Ballowe

## 2018-11-05 NOTE — Anesthesia Procedure Notes (Signed)
Anesthesia Regional Block: Interscalene brachial plexus block   Pre-Anesthetic Checklist: ,, timeout performed, Correct Patient, Correct Site, Correct Laterality, Correct Procedure, Correct Position, site marked, Risks and benefits discussed,  Surgical consent,  Pre-op evaluation,  At surgeon's request and post-op pain management  Laterality: Right  Prep: Maximum Sterile Barrier Precautions used, chloraprep       Needles:  Injection technique: Single-shot  Needle Type: Echogenic Stimulator Needle     Needle Length: 4cm  Needle Gauge: 22     Additional Needles:   Procedures:,,,, ultrasound used (permanent image in chart),,,,  Narrative:  Start time: 11/05/2018 11:12 AM End time: 11/05/2018 11:22 AM Injection made incrementally with aspirations every 5 mL.  Performed by: Personally  Anesthesiologist: Freddrick March, MD  Additional Notes: Monitors applied. No increased pain on injection. No increased resistance to injection. Injection made in 5cc increments. Good needle visualization. Patient tolerated procedure well.

## 2018-11-05 NOTE — Progress Notes (Signed)
Spoke with [employer] about sending a rep after procedure. Paged sent out for rep to call RN.

## 2018-11-05 NOTE — Interval H&P Note (Signed)
History and Physical Interval Note:  11/05/2018 12:39 PM  Shawn Burke  has presented today for surgery, with the diagnosis of Right shoulder pain and stiffness.  The various methods of treatment have been discussed with the patient and family. After consideration of risks, benefits and other options for treatment, the patient has consented to  Procedure(s): SHOULDER ARTHROSCOPY  capsular release and possible foreign body removal (Right) as a surgical intervention.  The patient's history has been reviewed, patient examined, no change in status, stable for surgery.  I have reviewed the patient's chart and labs.  Questions were answered to the patient's satisfaction.     Augustin Schooling

## 2018-11-05 NOTE — Discharge Instructions (Signed)
Ice to the shoulder constantly.  Keep the incision covered and clean and dry for one week, then ok to get it wet in the shower. ° °Do exercise as instructed several times per day. ° °DO NOT reach behind your back or push up out of a chair with the operative arm. ° °Use a sling while you are up and around for comfort, may remove while seated.  Keep pillow propped behind the operative elbow. ° °Follow up with Dr Shannyn Jankowiak in two weeks in the office, call [phone] for appt °

## 2018-11-05 NOTE — Progress Notes (Signed)
Assisted Dr. Woodrum with right, ultrasound guided, interscalene  block. Side rails up, monitors on throughout procedure. See vital signs in flow sheet. Tolerated Procedure well. 

## 2018-11-05 NOTE — Transfer of Care (Signed)
Immediate Anesthesia Transfer of Care Note  Patient: Shawn Burke  Procedure(s) Performed: SHOULDER ARTHROSCOPY   foreign body removal (Right )  Patient Location: PACU  Anesthesia Type:General  Level of Consciousness: drowsy and patient cooperative  Airway & Oxygen Therapy: Patient Spontanous Breathing and Patient connected to face mask oxygen  Post-op Assessment: Report given to RN and Post -op Vital signs reviewed and stable  Post vital signs: Reviewed and stable  Last Vitals:  Vitals Value Taken Time  BP 120/89 11/05/2018  2:39 PM  Temp    Pulse 71 11/05/2018  2:42 PM  Resp 20 11/05/2018  2:42 PM  SpO2 100 % 11/05/2018  2:42 PM  Vitals shown include unvalidated device data.  Last Pain:  Vitals:   11/05/18 1012  TempSrc: Oral         Complications: No apparent anesthesia complications

## 2018-11-05 NOTE — Brief Op Note (Signed)
11/05/2018  2:23 PM  PATIENT:  Shawn Burke  48 y.o. male  PRE-OPERATIVE DIAGNOSIS:  Right shoulder pain and stiffness, retained foreign body  POST-OPERATIVE DIAGNOSIS:  Same, intact rotator cuff repair   PROCEDURE:  Procedure(s): SHOULDER ARTHROSCOPY   foreign body removal (Right) debridement   SURGEON:  Surgeon(s) and Role:    Netta Cedars, MD - Primary  PHYSICIAN ASSISTANT:   ASSISTANTS: [place]ura Bruns, PA-C    ANESTHESIA:   regional and general  EBL:  Minimal   BLOOD ADMINISTERED:none  DRAINS: none   LOCAL MEDICATIONS USED:  MARCAINE     SPECIMEN:  Excision  Needle fragment removed under flouro control  DISPOSITION OF SPECIMEN:  N/A  COUNTS:  YES  TOURNIQUET:  * No tourniquets in log *  DICTATION: .Other Dictation: Dictation Number [id number]  PLAN OF CARE: Discharge to home after PACU  PATIENT DISPOSITION:  PACU - hemodynamically stable.   Delay start of Pharmacological VTE agent (>24hrs) due to surgical blood loss or risk of bleeding: not applicable

## 2018-11-05 NOTE — Progress Notes (Signed)
[employer] Rep is CHRIS BENSON, contact [phone].  Plans to arrive around 1400. If needed sooner, call.

## 2018-11-05 NOTE — Anesthesia Procedure Notes (Signed)
Arterial Line Insertion Start/End6/12/2018 12:35 PM, 11/05/2018 12:40 PM Performed by: Montel Clock, CRNA, CRNA  Patient location: Pre-op. Preanesthetic checklist: patient identified, IV checked, risks and benefits discussed, surgical consent, monitors and equipment checked, pre-op evaluation, timeout performed and anesthesia consent Lidocaine 1% used for infiltration Left, radial was placed Catheter size: 20 G Hand hygiene performed , maximum sterile barriers used  and Seldinger technique used Allen's test indicative of satisfactory collateral circulation Attempts: 1 Procedure performed without using ultrasound guided technique. Following insertion, dressing applied and Biopatch. Post procedure assessment: normal  Patient tolerated the procedure well with no immediate complications.

## 2018-11-05 NOTE — Op Note (Signed)
NAME: Shawn Burke, Shawn Burke MEDICAL RECORD IR:[id number] ACCOUNT [id number] DATE OF BIRTH:Mar 17, 1971 FACILITY: [facility] LOCATION: [facility] PHYSICIAN:STEVEN Orlena Sheldon, MD  OPERATIVE REPORT  DATE OF PROCEDURE:  11/05/2018  PREOPERATIVE DIAGNOSIS:  Recurrent right shoulder pain following rotator cuff surgery with retained foreign body (tip of suture needle).  POSTOPERATIVE DIAGNOSIS:  Recurrent right shoulder pain following rotator cuff surgery with retained foreign body (tip of the suture needle).  PROCEDURE PERFORMED:  Right shoulder arthroscopy with extensive intraarticular debridement including removal of some suture material and the release of scar tissue with arthroscopic bursectomy.  No acromioplasty was performed.  Then we did a removal of  retained foreign body/suture needle.  ATTENDING SURGEON:  Esmond Plants, MD  ASSISTANT:  Darol Destine, [place], who was scrubbed during the entire procedure and necessary for satisfactory completion of surgery.  ANESTHESIA:  General anesthesia was used plus interscalene block.  ESTIMATED BLOOD LOSS:  Minimal.  FLUID REPLACEMENT:  1000 mL crystalloid.  INSTRUMENT COUNTS:  Correct.  COMPLICATIONS:  No complications.  ANTIBIOTICS:  Perioperative antibiotics given.  INDICATIONS:  The patient is a 48 year old male who presents with a history of right shoulder surgery about a year ago for a rotator cuff tear.  The patient has gone on to have some ongoing shoulder pain, and on followup imaging, the patient was noted to  have a tip of the suture needle present in the soft tissues anterior to the humerus at the neck of the humerus.  This did interfere a little bit with the MRI scan casting a shadow and making it difficult to fully interpret the MRI as far as the  integrity of the rotator cuff repair.  We discussed options for management, recommending a shoulder arthroscopy to inspect his rotator cuff repair and clean up any scar tissue and removed  additional suture material that may be causing any kind of pain  and also to remove that suture needle, if possible, to be done under fluoroscopy control.  The patient agreed with this plan.  Informed consent was obtained.  DESCRIPTION OF PROCEDURE:  After an adequate level of anesthesia was achieved, the patient was positioned in modified beach-chair position.  Right shoulder correctly identified and was sterilely prepped and draped in the usual manner.  Time-out called,  verifying correct patient, correct site.  Exam under anesthesia revealed full passive range of motion with no significant stiffness.  We entered the shoulder joint using standard portals, identifying a little fraying around the superior labral edge where  it was debrided.  We debrided that using baskets and a shaver.  The cartilage in the shoulder joint with good subscap was intact.  The supraspinatus repair and infraspinatus repair were intact.  It appeared that the lateral aspect of that repair was  perfect.  The medial aspect was a little spotty in terms of it healing.  There was some loose suture that we could remove using baskets and a pituitary rongeur.  We scuffed up the bone there at the greater tuberosity rotator cuff insertion site just to  encourage healing.  At this point, we inspected from the anterior portal looking posteriorly.  The teres minor and posterior infraspinatus looked great and the labrum looked good.  We placed the scope in subacromial space.  Bursectomy performed.  We  inspected the rotator cuff from the bursal side, and it looked normal.  We palpated that, and there were no soft areas or areas of damage.  At this point, we concluded the arthroscopic portion of the  procedure.  We then used fluoroscopy, which we draped  into the field, and we were able to identify the needle tip, made a small incision directly overlying that, went down with a needle driver and were able to under fluoroscopic control remove that  needle tip.  We verified that it was out of the patient.   We irrigated thoroughly and closed the wounds with interrupted nylon closure.  A sterile bandage and a shoulder sling were applied.  The patient tolerated surgery well.  LN/NUANCE  D:11/05/2018 T:11/05/2018 JOB:[id number]

## 2018-11-05 NOTE — Anesthesia Procedure Notes (Signed)
Procedure Name: Intubation Date/Time: 11/05/2018 1:05 PM Performed by: Montel Clock, CRNA Pre-anesthesia Checklist: Patient identified, Emergency Drugs available, Suction available, Patient being monitored and Timeout performed Patient Re-evaluated:Patient Re-evaluated prior to induction Oxygen Delivery Method: Circle system utilized Preoxygenation: Pre-oxygenation with 100% oxygen Induction Type: IV induction and Rapid sequence Laryngoscope Size: Mac and 3 Grade View: Grade II Tube type: Oral Tube size: 7.5 mm Number of attempts: 1 Airway Equipment and Method: Stylet Placement Confirmation: ETT inserted through vocal cords under direct vision,  positive ETCO2 and breath sounds checked- equal and bilateral Secured at: 24 cm Tube secured with: Tape Dental Injury: Teeth and Oropharynx as per pre-operative assessment  Comments: Grade 2b view (bottom of cords visible) with MAC 3 and downward pressure. ETT easily passed through glottis. MAC 4 may provide better view given glottis relatively deep.

## 2018-11-06 ENCOUNTER — Encounter (HOSPITAL_COMMUNITY): Payer: Self-pay | Admitting: Orthopedic Surgery

## 2018-11-21 ENCOUNTER — Encounter: Payer: Self-pay | Admitting: Family Medicine

## 2018-11-21 ENCOUNTER — Ambulatory Visit (INDEPENDENT_AMBULATORY_CARE_PROVIDER_SITE_OTHER): Payer: 59 | Admitting: Family Medicine

## 2018-11-21 VITALS — BP 128/97 | HR 80 | Temp 97.8°F | Wt 209.0 lb

## 2018-11-21 DIAGNOSIS — Z95810 Presence of automatic (implantable) cardiac defibrillator: Secondary | ICD-10-CM

## 2018-11-21 DIAGNOSIS — I10 Essential (primary) hypertension: Secondary | ICD-10-CM | POA: Diagnosis not present

## 2018-11-21 DIAGNOSIS — I2510 Atherosclerotic heart disease of native coronary artery without angina pectoris: Secondary | ICD-10-CM

## 2018-11-21 DIAGNOSIS — E785 Hyperlipidemia, unspecified: Secondary | ICD-10-CM

## 2018-11-21 DIAGNOSIS — I5022 Chronic systolic (congestive) heart failure: Secondary | ICD-10-CM | POA: Diagnosis not present

## 2018-11-21 DIAGNOSIS — Z1211 Encounter for screening for malignant neoplasm of colon: Secondary | ICD-10-CM | POA: Diagnosis not present

## 2018-11-21 DIAGNOSIS — K219 Gastro-esophageal reflux disease without esophagitis: Secondary | ICD-10-CM

## 2018-11-21 NOTE — Patient Instructions (Signed)
Thank you for coming in today. Recheck in 5 months or sooner if needed.  You should hear about discussion with stomach doctor to discuss coloscopy.  Recheck with me sooner if needed.  We should do a flu shot this fall.  Ok to get it done at pharmacy or schedule a nurse visit here.

## 2018-11-21 NOTE — Progress Notes (Signed)
Shawn Burke is a 48 y.o. male who presents to [facility] today for follow-up hypertension and heart disease.  Patient establish care last month.  He had a complicated heart history including systolic heart failure managed with furosemide beta-blocker spironolactone.  At the last visit his Coreg dose was corrected and decreased back down to 6.25 mg twice daily.  In the interim he notes that he feels well.  No chest pain palpitation shortness of breath.  Additionally he had acid reflux and was prescribed Protonix.  He notes this is helped and he feels good.  Additionally patient had significant shoulder pain and in the interim had right shoulder arthroscopy with removal of foreign body on 11/05/18.  His shoulder is feeling a lot better also.  Additionally he notes that he is due for colon cancer screening. When asked he states that he "does not want to shit in a box like a dog" and would prefer colonoscopy as opposed to Cologuard.  ROS as above:  Exam:  BP (!) 128/97   Pulse 80   Temp 97.8 F (36.6 C) (Oral)   Wt 209 lb (94.8 kg)   BMI 29.99 kg/m  Wt Readings from Last 5 Encounters:  11/21/18 209 lb (94.8 kg)  11/01/18 200 lb (90.7 kg)  10/24/18 209 lb 12.8 oz (95.2 kg)  02/23/18 188 lb (85.3 kg)    Gen: Well NAD HEENT: EOMI,  MMM Lungs: Normal work of breathing. CTABL Heart: RRR no MRG Abd: NABS, Soft. Nondistended, Nontender Exts: Brisk capillary refill, warm and well perfused.   Lab and Radiology Results   Chemistry      Component Value Date/Time   NA 139 11/01/2018 1122   NA 140 10/13/2017   K 3.8 11/01/2018 1122   CL 106 11/01/2018 1122   CO2 24 11/01/2018 1122   BUN 16 11/01/2018 1122   BUN 12 10/13/2017   CREATININE 1.02 11/01/2018 1122   CREATININE 1.00 10/24/2018 1438   GLU 95 10/13/2017      Component Value Date/Time   CALCIUM 9.1  11/01/2018 1122   ALKPHOS 60 02/23/2018 1047   AST 22 10/24/2018 1438   ALT 24 10/24/2018 1438   BILITOT 0.5 10/24/2018 1438        Assessment and Plan: 48 y.o. male with  Hypertension and coronary artery disease heart failure.  Doing well.  Labs are stable blood pressure is controlled.  Patient is pretty optimized.  Plan to continue current regimen and recheck in 5 months or sooner if needed.  Acid reflux: Doing with Protonix continue current regimen.  Colon cancer screening: Patient obviously has several other medical problems that makes a sedation needed for colonoscopy higher risk.  He is not willing to consider alternative methods but is willing to proceed with colonoscopy.  He notes that if he does not have to do it he would prefer not to.  I think the benefit from colonoscopy for screening for colon cancer probably outweighs the risk of the sedation however I think that is a great discussion to have with gastroenterology.  Will place referral and he can have more further consultation and discussion with gastroenterology about pros and cons of colonoscopy for colon cancer screening.  PDMP not reviewed this encounter. Orders Placed This Encounter  Procedures  . Ambulatory referral to Gastroenterology    Referral Priority:   Routine    Referral Type:   Consultation    Referral  Reason:   Specialty Services Required    Number of Visits Requested:   1   No orders of the defined types were placed in this encounter.    Historical information moved to improve visibility of documentation.  Past Medical History:  Diagnosis Date  . AICD (automatic cardioverter/defibrillator) present 2018   Biotronik  . Chronic systolic heart failure ([place]) 10/24/2018  . Coronary artery disease    2017 - 1 stent. 5 in October 2018  . Dyspnea    WITH EXERTION  . GERD (gastroesophageal reflux disease)   . Hypertension   . Myocardial infarction ([place])     X 1 2017x 2 in 02/2017  . Peripheral vascular  disease ([place]) 20 YRS AGO   DVT in right leg   Past Surgical History:  Procedure Laterality Date  . CARDIAC CATHETERIZATION    . CHOLECYSTECTOMY    . CORONARY ANGIOPLASTY    . ICD IMPLANT  2018   BIOTRONIK  . KNEE ARTHROSCOPY Right    x 2  . NASAL SEPTUM SURGERY      x 3  . SHOULDER ARTHROSCOPY Left   . SHOULDER ARTHROSCOPY WITH ROTATOR CUFF REPAIR Right 11/05/2018   Procedure: SHOULDER ARTHROSCOPY   foreign body removal;  Surgeon: Netta Cedars, MD;  Location: [facility];  Service: Orthopedics;  Laterality: Right;  . SHOULDER ARTHROSCOPY WITH ROTATOR CUFF REPAIR AND SUBACROMIAL DECOMPRESSION Right 02/23/2018   Procedure: RIGHT SHOULDER ARTHROSCOPY WITH OPEN ROTATOR CUFF REPAIR, SUBACROMIAL DECOMPRESSION, OPEN DISTAL CLAVICLE RESECTION, OPEN BICEP TENODESIS, LABRAL DEBRIDEMENT;  Surgeon: Netta Cedars, MD;  Location: [facility];  Service: Orthopedics;  Laterality: Right;  . STENTS TO HEART X 6  1 X 2017 5 IN 2018  . WRIST SURGERY Right    Social History   Tobacco Use  . Smoking status: Former Smoker    Types: Cigarettes  . Smokeless tobacco: Current User    Types: Snuff  . Tobacco comment: quit 2-3 years ago  Substance Use Topics  . Alcohol use: Yes    Comment:  occasional   family history includes Heart attack in his father.  Medications: Current Outpatient Medications  Medication Sig Dispense Refill  . aspirin EC 81 MG tablet Take 81 mg by mouth daily.    Marland Kitchen atorvastatin (LIPITOR) 80 MG tablet Take 1 tablet (80 mg total) by mouth daily. For cholesterol 90 tablet 1  . carvedilol (COREG) 6.25 MG tablet Take 1 tablet (6.25 mg total) by mouth 2 (two) times daily. For heart and blood pressure 180 tablet 1  . clopidogrel (PLAVIX) 75 MG tablet Take 1 tablet (75 mg total) by mouth daily. For stents 90 tablet 1  . furosemide (LASIX) 20 MG tablet Take 1 tablet (20 mg total) by mouth daily. Fluid pill 90 tablet 1  . GOODYS EXTRA STRENGTH 520-260-32.5 MG PACK Take 1 packet by mouth 3 (three)  times daily as needed (for headaches/toothaches.).    Marland Kitchen methocarbamol (ROBAXIN) 500 MG tablet Take 1 tablet (500 mg total) by mouth 3 (three) times daily as needed. 40 tablet 1  . nitroGLYCERIN (NITROSTAT) 0.4 MG SL tablet Place 0.4 mg under the tongue every 5 (five) minutes as needed for chest pain.     Marland Kitchen oxyCODONE-acetaminophen (PERCOCET) 5-325 MG tablet Take 1 tablet by mouth every 4 (four) hours as needed for severe pain. 20 tablet 0  . pantoprazole (PROTONIX) 40 MG tablet Take 1 tablet (40 mg total) by mouth daily. For acid reflux 90 tablet 1  . spironolactone (  ALDACTONE) 25 MG tablet Take 1 tablet (25 mg total) by mouth daily. For heart 90 tablet 1   No current facility-administered medications for this visit.    Allergies  Allergen Reactions  . Coconut Flavor Anaphylaxis, Shortness Of Breath and Swelling     Discussed warning signs or symptoms. Please see discharge instructions. Patient expresses understanding.

## 2018-12-26 ENCOUNTER — Telehealth (INDEPENDENT_AMBULATORY_CARE_PROVIDER_SITE_OTHER): Payer: 59 | Admitting: Gastroenterology

## 2018-12-26 ENCOUNTER — Encounter: Payer: Self-pay | Admitting: Gastroenterology

## 2018-12-26 ENCOUNTER — Other Ambulatory Visit: Payer: Self-pay

## 2018-12-26 VITALS — Ht 70.0 in | Wt 207.0 lb

## 2018-12-26 DIAGNOSIS — I2510 Atherosclerotic heart disease of native coronary artery without angina pectoris: Secondary | ICD-10-CM

## 2018-12-26 DIAGNOSIS — I252 Old myocardial infarction: Secondary | ICD-10-CM | POA: Diagnosis not present

## 2018-12-26 DIAGNOSIS — I5022 Chronic systolic (congestive) heart failure: Secondary | ICD-10-CM | POA: Diagnosis not present

## 2018-12-26 DIAGNOSIS — K219 Gastro-esophageal reflux disease without esophagitis: Secondary | ICD-10-CM | POA: Diagnosis not present

## 2018-12-26 DIAGNOSIS — Z1211 Encounter for screening for malignant neoplasm of colon: Secondary | ICD-10-CM

## 2018-12-26 NOTE — Patient Instructions (Signed)
If you are age 50 or older, your body mass index should be between 23-30. Your Body mass index is 29.7 kg/m. If this is out of the aforementioned range listed, please consider follow up with your Primary Care Provider.  If you are age 74 or younger, your body mass index should be between 19-25. Your Body mass index is 29.7 kg/m. If this is out of the aformentioned range listed, please consider follow up with your Primary Care Provider.   To help prevent the possible spread of infection to our patients, communities, and staff; we will be implementing the following measures:  As of now we are not allowing any visitors/family members to accompany you to any upcoming appointments with [facility]. If you have any concerns about this please contact our office to discuss prior to the appointment.   You will be contacted by our office prior to your procedure for directions on holding your Plavix.  If you do not hear from our office 1 week prior to your scheduled procedure, please call [phone] to discuss.   It has been recommended to you by your physician that you have a(n) Colonoscopy at [facility] completed. We will contact you when we have an available date for your procedure.  It was a pleasure to see you today!  Vito Cirigliano, D.O.

## 2018-12-26 NOTE — Progress Notes (Signed)
Chief Complaint: Colon cancer screening, systemic antiplatelet therapy  Referring Provider:     Gregor Hams, MD   HPI:    Due to current restrictions/limitations of in-office visits due to the COVID-19 pandemic, this scheduled clinical appointment was converted to a telehealth virtual consultation using Doximity.  Visual link failed so this was converted to a telephone call.  -Time: 24 minutes -The patient did consent to this virtual visit and is aware of possible charges through their insurance for this visit.  -Names of all parties present: Shawn Burke (patient), Gerrit Heck, DO, [facility] (physician) -Patient location: Home -Physician location: Office  Shawn Burke is a 48 y.o. male with a history of hypertension, CAD (STEMI 03/2016, STEMI 02/2017 c/b VF arrest; STEMI 03/2017), PVD, heart failure, ICD in place, and GERD referred to the Gastroenterology Clinic for evaluation of routine, age-appropriate CRC screening.  No previous CRC screening.  No known personal or family history of GI malignancy, IBD.  He is otherwise without GI symptoms, and denies hematochezia, melena, change in bowel habits, nausea, vomiting, fever, chills, weight loss.  He does have significant cardiovascular disease to include STEMI requiring LAD stent 03/2016, STEMI d/t pRCA thrombus, s/p DES RCA and PTCA mPDA c/b VF arrest 02/2017; STEMI d/t occluded small distal PDA 03/2017 s/p DES to oLAD and mLAD.  Nuclear stress test in 06/2017 at [facility] with fixed, large size, severe great perfusion defect involving the apical and periapical segments indicating prior MI.  EF 30%. Currently taking ASA 81 mg and Plavix.    Did recently undergo right shoulder arthroscopy in 10/2018 without issues with anesthesia (regional and general). Held ASA and Plavix x5 days prior to surgery. Follows with Dr. Opal Sidles at [facility] in [place], [place]. Has a f/u appt end of August.   Separately, he has a  longstanding history of reflux, which is generally well controlled with Protonix 40 mg/day.  Past medical history, past surgical history, social history, family history, medications, and allergies reviewed in the chart and with patient.    Past Medical History:  Diagnosis Date  . AICD (automatic cardioverter/defibrillator) present 2018   Biotronik  . Chronic systolic heart failure ([place]) 10/24/2018  . Coronary artery disease    2017 - 1 stent. 5 in October 2018  . Dyspnea    WITH EXERTION  . GERD (gastroesophageal reflux disease)   . Hypertension   . Myocardial infarction ([place])     X 1 2017x 2 in 02/2017  . Peripheral vascular disease ([place]) 20 YRS AGO   DVT in right leg     Past Surgical History:  Procedure Laterality Date  . CARDIAC CATHETERIZATION    . CHOLECYSTECTOMY    . CORONARY ANGIOPLASTY    . ICD IMPLANT  2018   BIOTRONIK  . KNEE ARTHROSCOPY Right    x 2  . NASAL SEPTUM SURGERY      x 3  . SHOULDER ARTHROSCOPY Left   . SHOULDER ARTHROSCOPY WITH ROTATOR CUFF REPAIR Right 11/05/2018   Procedure: SHOULDER ARTHROSCOPY   foreign body removal;  Surgeon: Netta Cedars, MD;  Location: [facility];  Service: Orthopedics;  Laterality: Right;  . SHOULDER ARTHROSCOPY WITH ROTATOR CUFF REPAIR AND SUBACROMIAL DECOMPRESSION Right 02/23/2018   Procedure: RIGHT SHOULDER ARTHROSCOPY WITH OPEN ROTATOR CUFF REPAIR, SUBACROMIAL DECOMPRESSION, OPEN DISTAL CLAVICLE RESECTION, OPEN BICEP TENODESIS, LABRAL DEBRIDEMENT;  Surgeon: Netta Cedars, MD;  Location: [facility];  Service:  Orthopedics;  Laterality: Right;  . STENTS TO HEART X 6  1 X 2017 5 IN 2018  . WRIST SURGERY Right    Family History  Problem Relation Age of Onset  . Heart attack Father   . Colon cancer Neg Hx    Social History   Tobacco Use  . Smoking status: Former Smoker    Types: Cigarettes  . Smokeless tobacco: Current User    Types: Snuff  . Tobacco comment: quit 2-3 years ago  Substance Use Topics  . Alcohol use: Yes     Comment:  occasional  . Drug use: Not Currently   Current Outpatient Medications  Medication Sig Dispense Refill  . aspirin EC 81 MG tablet Take 81 mg by mouth daily.    Marland Kitchen atorvastatin (LIPITOR) 80 MG tablet Take 1 tablet (80 mg total) by mouth daily. For cholesterol 90 tablet 1  . carvedilol (COREG) 6.25 MG tablet Take 1 tablet (6.25 mg total) by mouth 2 (two) times daily. For heart and blood pressure 180 tablet 1  . clopidogrel (PLAVIX) 75 MG tablet Take 1 tablet (75 mg total) by mouth daily. For stents 90 tablet 1  . furosemide (LASIX) 20 MG tablet Take 1 tablet (20 mg total) by mouth daily. Fluid pill 90 tablet 1  . GOODYS EXTRA STRENGTH 520-260-32.5 MG PACK Take 1 packet by mouth 3 (three) times daily as needed (for headaches/toothaches.).    Marland Kitchen methocarbamol (ROBAXIN) 500 MG tablet Take 1 tablet (500 mg total) by mouth 3 (three) times daily as needed. 40 tablet 1  . nitroGLYCERIN (NITROSTAT) 0.4 MG SL tablet Place 0.4 mg under the tongue every 5 (five) minutes as needed for chest pain.     Marland Kitchen oxyCODONE-acetaminophen (PERCOCET) 5-325 MG tablet Take 1 tablet by mouth every 4 (four) hours as needed for severe pain. 20 tablet 0  . pantoprazole (PROTONIX) 40 MG tablet Take 1 tablet (40 mg total) by mouth daily. For acid reflux 90 tablet 1  . spironolactone (ALDACTONE) 25 MG tablet Take 1 tablet (25 mg total) by mouth daily. For heart 90 tablet 1   No current facility-administered medications for this visit.    Allergies  Allergen Reactions  . Coconut Flavor Anaphylaxis, Shortness Of Breath and Swelling     Review of Systems: All systems reviewed and negative except where noted in HPI.     Physical Exam:    Complete physical exam not completed due to the nature of this telehealth communication.   Gen: Awake, alert, and oriented, and well communicative. Psych: Pleasant, cooperative, normal speech, thought processing seemingly intact   ASSESSMENT AND PLAN;   1) CRC Screening:  Shawn Burke is a 48 y.o. male presenting to the Gastroenterology Clinic for initial CRC screening. No family history of CRC or related malignancies, and patient is without any active GI sxs. No previous CRC screening to date. Discussed options for CRC screening, to include optical vs virtual colonoscopy - the risks and benefits and pros and cons of each, as well as discussion of FIT kit testing, Cologuard, etc, and the patient decided to proceed with an optical colonoscopy.  Discussed his cardiovascular disease with relation to colonoscopy at great length.  He adamantly refuses Cologuard testing, but does want to proceed with CRC screening with colonoscopy.  -Given cardiovascular disease, plan to schedule colonoscopy at [place] at [facility] prior to procedure  - Bowel prep ordered with plan for instruction with GI clinical staff - All questions  answered  The indications, risks, and benefits of colonoscopy were explained to the patient in detail. Risks include but are not limited to bleeding, perforation, adverse reaction to medications, and cardiopulmonary compromise. Sequelae include but are not limited to the possibility of surgery, hospitalization, and mortality. The patient verbalized understanding and wished to proceed. All questions answered, referred for scheduling and bowel prep ordered. Further recommendations pending results of the exam.   2) CAD/history of MI 3) Systemic antiplatelet therapy 4) CHF  -Had a long discussion with patient today regarding his CV history, sedation risks associated with colonoscopy, and need for antiplatelet therapy.  He would strongly like to proceed with optical colonoscopy as noted above. -Request clearance from his Cardiologist to proceed with colonoscopy -Would like to hold Plavix 5 days before procedure - will instruct when and how to resume after procedure. Low but real risk of cardiovascular event such as heart attack, stroke, embolism,  thrombosis or ischemia/infarct of other organs off Plavix explained and need to seek urgent help if this occurs. The patient consents to proceed. Will communicate by phone or EMR with patient's prescribing provider to confirm that holding Plavix is reasonable in this case -Okay to resume aspirin in the perioperative period  5) GERD: -Well-controlled on current PPI therapy -Resume antireflux lifestyle measures    Lavena Bullion, DO, FACG  12/26/2018, 8:28 AM   Gregor Hams, MD

## 2019-01-17 ENCOUNTER — Other Ambulatory Visit: Payer: Self-pay | Admitting: Family Medicine

## 2019-03-07 ENCOUNTER — Other Ambulatory Visit: Payer: Self-pay

## 2019-03-07 ENCOUNTER — Ambulatory Visit (INDEPENDENT_AMBULATORY_CARE_PROVIDER_SITE_OTHER): Payer: 59

## 2019-03-07 ENCOUNTER — Encounter: Payer: Self-pay | Admitting: Family Medicine

## 2019-03-07 ENCOUNTER — Ambulatory Visit (INDEPENDENT_AMBULATORY_CARE_PROVIDER_SITE_OTHER): Payer: 59 | Admitting: Family Medicine

## 2019-03-07 VITALS — BP 136/87 | HR 78 | Wt 220.0 lb

## 2019-03-07 DIAGNOSIS — N644 Mastodynia: Secondary | ICD-10-CM | POA: Diagnosis not present

## 2019-03-07 DIAGNOSIS — M25562 Pain in left knee: Secondary | ICD-10-CM | POA: Diagnosis not present

## 2019-03-07 MED ORDER — DICLOFENAC SODIUM 1 % TD GEL: 4.0000 g | Freq: Four times a day (QID) | TRANSDERMAL | 11 refills | Status: DC

## 2019-03-07 NOTE — Progress Notes (Signed)
Shawn Burke is a 48 y.o. male who presents to [place] today for left knee pain.  Starting Sunday, October 4 Jammey missed a step going down stairs on his porch and landed on his left foot with a fall of about 2 feet.  He developed medial knee pain.  He has had some pain and swelling.  He is tried compression ice and rest which has helped a little.  Feels well otherwise with no fevers or chills nausea vomiting or diarrhea.  He denies any locking or catching.  He notes a little bit of right nipple soreness over the last 2 months.  He does not think he has much swelling.  He denies any injury.  He denies any fevers or chills.   ROS:  As above  Exam:  BP 136/87   Pulse 78   Wt 220 lb (99.8 kg)   BMI 31.57 kg/m  Wt Readings from Last 5 Encounters:  03/07/19 220 lb (99.8 kg)  12/26/18 207 lb (93.9 kg)  11/21/18 209 lb (94.8 kg)  11/01/18 200 lb (90.7 kg)  10/24/18 209 lb 12.8 oz (95.2 kg)   General: Well Developed, well nourished, and in no acute distress.  Neuro/Psych: Alert and oriented x3, extra-ocular muscles intact, able to move all 4 extremities, sensation grossly intact. Skin: Warm and dry, no rashes noted.  Right chest/breast: Normal-appearing not particularly swollen.  Minimally tender.  No masses palpated. Respiratory: Not using accessory muscles, speaking in full sentences, trachea midline.  Cardiovascular: Pulses palpable, no extremity edema. Abdomen: Does not appear distended. MSK:  Left knee: Mild effusion otherwise normal-appearing Range of motion 0-120 degrees. Tender palpation medial joint line. Stable ligaments exam. Negative McMurray's test. Intact strength.    Lab and Radiology Results X-ray images left knee obtained today personally and independently reviewed. Mild degenerative changes especially medial compartment.  No acute fractures visible. Await formal radiology review.    Assessment and  Plan: 48 y.o. male with left knee pain concern for meniscus injury based on description.  No fractures visible per my x-ray review.  Discussed options.  Plan for diclofenac gel and steroid injection as above.  Recheck if not improving.  Nipple pain: Ongoing for 2 months.  Unclear etiology.  Could be developing gynecomastia.  Patient does take spironolactone which can cause this.  However I did not appreciate much gynecomastia on exam.  Discussed options.  I would recommend diagnostic mammogram however patient declined and would like to proceed with watchful waiting.   PDMP not reviewed this encounter. Orders Placed This Encounter  Procedures  . DG Knee 1-2 Views Right    Standing Status:   Future    Number of Occurrences:   1    Standing Expiration Date:   05/07/2020    Order Specific Question:   Reason for Exam (SYMPTOM  OR DIAGNOSIS REQUIRED)    Answer:   For use with the left knee x-ray bilateral AP and Rosenberg standing.    Order Specific Question:   Preferred imaging location?    Answer:   [facility]  . DG Knee Complete 4 Views Left    Please include patellar sunrise, lateral, and weightbearing bilateral AP and bilateral rosenberg views    Standing Status:   Future    Number of Occurrences:   1    Standing Expiration Date:   05/06/2020    Order Specific Question:   Reason for exam:    Answer:   Please  include patellar sunrise, lateral, and weightbearing bilateral AP and bilateral rosenberg views    Comments:   Please include patellar sunrise, lateral, and weightbearing bilateral AP and bilateral rosenberg views    Order Specific Question:   Preferred imaging location?    Answer:   [facility]   Meds ordered this encounter  Medications  . diclofenac sodium (VOLTAREN) 1 % GEL    Sig: Apply 4 g topically 4 (four) times daily. To affected joint.    Dispense:  100 g    Refill:  11    Historical information moved to improve visibility of documentation.  Past  Medical History:  Diagnosis Date  . AICD (automatic cardioverter/defibrillator) present 2018   Biotronik  . Chronic systolic heart failure ([place]) 10/24/2018  . Coronary artery disease    2017 - 1 stent. 5 in October 2018  . Dyspnea    WITH EXERTION  . GERD (gastroesophageal reflux disease)   . Hypertension   . Myocardial infarction ([place])     X 1 2017x 2 in 02/2017  . Peripheral vascular disease ([place]) 20 YRS AGO   DVT in right leg   Past Surgical History:  Procedure Laterality Date  . CARDIAC CATHETERIZATION    . CHOLECYSTECTOMY    . CORONARY ANGIOPLASTY    . ICD IMPLANT  2018   BIOTRONIK  . KNEE ARTHROSCOPY Right    x 2  . NASAL SEPTUM SURGERY      x 3  . SHOULDER ARTHROSCOPY Left   . SHOULDER ARTHROSCOPY WITH ROTATOR CUFF REPAIR Right 11/05/2018   Procedure: SHOULDER ARTHROSCOPY   foreign body removal;  Surgeon: Netta Cedars, MD;  Location: [facility];  Service: Orthopedics;  Laterality: Right;  . SHOULDER ARTHROSCOPY WITH ROTATOR CUFF REPAIR AND SUBACROMIAL DECOMPRESSION Right 02/23/2018   Procedure: RIGHT SHOULDER ARTHROSCOPY WITH OPEN ROTATOR CUFF REPAIR, SUBACROMIAL DECOMPRESSION, OPEN DISTAL CLAVICLE RESECTION, OPEN BICEP TENODESIS, LABRAL DEBRIDEMENT;  Surgeon: Netta Cedars, MD;  Location: [facility];  Service: Orthopedics;  Laterality: Right;  . STENTS TO HEART X 6  1 X 2017 5 IN 2018  . WRIST SURGERY Right    Social History   Tobacco Use  . Smoking status: Former Smoker    Types: Cigarettes  . Smokeless tobacco: Current User    Types: Snuff  . Tobacco comment: quit 2-3 years ago  Substance Use Topics  . Alcohol use: Yes    Comment:  occasional   family history includes Heart attack in his father.  Medications: Current Outpatient Medications  Medication Sig Dispense Refill  . aspirin EC 81 MG tablet Take 81 mg by mouth daily.    Marland Kitchen atorvastatin (LIPITOR) 80 MG tablet Take 1 tablet (80 mg total) by mouth daily. For cholesterol 90 tablet 1  . carvedilol (COREG) 6.25 MG  tablet Take 1 tablet (6.25 mg total) by mouth 2 (two) times daily. For heart and blood pressure 180 tablet 1  . clopidogrel (PLAVIX) 75 MG tablet Take 1 tablet (75 mg total) by mouth daily. For stents 90 tablet 1  . diclofenac sodium (VOLTAREN) 1 % GEL Apply 4 g topically 4 (four) times daily. To affected joint. 100 g 11  . furosemide (LASIX) 20 MG tablet TAKE 1 TABLET BY MOUTH DAILY FOR FLUID PILLS 90 tablet 1  . GOODYS EXTRA STRENGTH 520-260-32.5 MG PACK Take 1 packet by mouth 3 (three) times daily as needed (for headaches/toothaches.).    Marland Kitchen methocarbamol (ROBAXIN) 500 MG tablet Take 1 tablet (500 mg total) by  mouth 3 (three) times daily as needed. 40 tablet 1  . nitroGLYCERIN (NITROSTAT) 0.4 MG SL tablet Place 0.4 mg under the tongue every 5 (five) minutes as needed for chest pain.     Marland Kitchen oxyCODONE-acetaminophen (PERCOCET) 5-325 MG tablet Take 1 tablet by mouth every 4 (four) hours as needed for severe pain. 20 tablet 0  . pantoprazole (PROTONIX) 40 MG tablet Take 1 tablet (40 mg total) by mouth daily. For acid reflux 90 tablet 1  . spironolactone (ALDACTONE) 25 MG tablet Take 1 tablet (25 mg total) by mouth daily. For heart 90 tablet 1   No current facility-administered medications for this visit.    Allergies  Allergen Reactions  . Coconut Flavor Anaphylaxis, Shortness Of Breath and Swelling      Discussed warning signs or symptoms. Please see discharge instructions. Patient expresses understanding.

## 2019-03-07 NOTE — Patient Instructions (Addendum)
Thank you for coming in today. Call or go to the ER if you develop a large red swollen joint with extreme pain or oozing puss.  Use diclofeanc gel for knee pain Keep me updated.  Let me know if not better.

## 2019-03-08 DIAGNOSIS — N644 Mastodynia: Secondary | ICD-10-CM | POA: Insufficient documentation

## 2019-03-12 ENCOUNTER — Telehealth: Payer: Self-pay

## 2019-03-12 NOTE — Telephone Encounter (Signed)
Spoke with Dossie Arbour at [facility] in [place] Phone: [phone] Fax: [phone]. She confirmed the fax number for Cardiac Clearance to be re-sent. First sent on 03/06/2019. Refaxed Cardiac Clearance for Plavix hold to Dr. Joylene John on 03/12/2019.

## 2019-03-13 NOTE — Telephone Encounter (Signed)
When you receive the cardiac clearance, place a note on the recall list and then wait for a Dec date as the Nov date is full-you can schedule patient when the date becomes available;

## 2019-03-20 ENCOUNTER — Telehealth: Payer: Self-pay

## 2019-03-20 NOTE — Telephone Encounter (Signed)
Cardiac Clearance was obtained by Dr. Domenica Fail stating 'Plavix may be held for 5 days, moderate risk'.

## 2019-03-27 ENCOUNTER — Other Ambulatory Visit: Payer: Self-pay

## 2019-03-27 ENCOUNTER — Ambulatory Visit (INDEPENDENT_AMBULATORY_CARE_PROVIDER_SITE_OTHER): Payer: 59 | Admitting: Family Medicine

## 2019-03-27 ENCOUNTER — Encounter: Payer: Self-pay | Admitting: Family Medicine

## 2019-03-27 VITALS — BP 120/81 | HR 93 | Temp 98.1°F | Ht 72.0 in | Wt 215.6 lb

## 2019-03-27 DIAGNOSIS — M25562 Pain in left knee: Secondary | ICD-10-CM

## 2019-03-27 DIAGNOSIS — I10 Essential (primary) hypertension: Secondary | ICD-10-CM | POA: Diagnosis not present

## 2019-03-27 DIAGNOSIS — I2510 Atherosclerotic heart disease of native coronary artery without angina pectoris: Secondary | ICD-10-CM

## 2019-03-27 DIAGNOSIS — N644 Mastodynia: Secondary | ICD-10-CM | POA: Diagnosis not present

## 2019-03-27 DIAGNOSIS — I5022 Chronic systolic (congestive) heart failure: Secondary | ICD-10-CM

## 2019-03-27 MED ORDER — ATORVASTATIN CALCIUM 80 MG PO TABS: 80.0000 mg | ORAL_TABLET | Freq: Every day | ORAL | 1 refills | Status: DC

## 2019-03-27 MED ORDER — FUROSEMIDE 20 MG PO TABS: ORAL_TABLET | ORAL | 1 refills | Status: DC

## 2019-03-27 MED ORDER — PANTOPRAZOLE SODIUM 40 MG PO TBEC: 40.0000 mg | DELAYED_RELEASE_TABLET | Freq: Every day | ORAL | 1 refills | Status: DC

## 2019-03-27 MED ORDER — SPIRONOLACTONE 25 MG PO TABS: 25.0000 mg | ORAL_TABLET | Freq: Every day | ORAL | 1 refills | Status: DC

## 2019-03-27 MED ORDER — CARVEDILOL 6.25 MG PO TABS: 6.2500 mg | ORAL_TABLET | Freq: Two times a day (BID) | ORAL | 1 refills | Status: DC

## 2019-03-27 MED ORDER — CLOPIDOGREL BISULFATE 75 MG PO TABS: 75.0000 mg | ORAL_TABLET | Freq: Every day | ORAL | 1 refills | Status: DC

## 2019-03-27 NOTE — Progress Notes (Signed)
Shawn Burke is a 48 y.o. male who presents to [name] today for follow-up hypertension, discuss nipple pain and swelling, follow-up knee pain.  Patient had an extensive cardiology work-up and treatment for myocardial infarction.  He received his cardiology care from   Cardiology is Dr Domenica Fail at [facility], [id number] [place],  [address]  Phone: [phone]  Fax: [phone]  He feels well.  No chest pain palpitations shortness of breath.  He takes medications well with no issues.  Additionally Left knee pain. Seen on 03/07/19. Xray showed mild DJD. Treated with Voltaren gel.  DId not help much.  He notes his pain is still somewhat present.  However he is happy with how things are and does not wish to pursue this issue further.   Additionally he notes right nipple pain and swelling.  He was seen for this in early October as well.  At that time he declined diagnostic mammogram although at this point he wishes to proceed.  He notes is moderately painful.   Additionally he notes that he is trying to get a colonoscopy.  His gastroenterologist waiting from his cardiologist to approve colonoscopy from a heart safety standpoint.  ROS as above:  Exam:  BP 120/81   Pulse 93   Temp 98.1 F (36.7 C) (Oral)   Ht 6' (1.829 m)   Wt 215 lb 9.6 oz (97.8 kg)   BMI 29.24 kg/m  Wt Readings from Last 5 Encounters:  03/27/19 215 lb 9.6 oz (97.8 kg)  03/07/19 220 lb (99.8 kg)  12/26/18 207 lb (93.9 kg)  11/21/18 209 lb (94.8 kg)  11/01/18 200 lb (90.7 kg)    Gen: Well NAD HEENT: EOMI,  MMM Lungs: Normal work of breathing. CTABL Heart: RRR no MRG Chest wall: Right nipple slight cystic-appearing mass lateral aspect of nipple.  Mildly tender palpation. Abd: NABS, Soft. Nondistended, Nontender Exts: Brisk capillary refill, warm and well  perfused.   Lab and Radiology Results   Chemistry      Component Value Date/Time   NA 139 11/01/2018 1122   NA 140 10/13/2017   K 3.8 11/01/2018 1122   CL 106 11/01/2018 1122   CO2 24 11/01/2018 1122   BUN 16 11/01/2018 1122   BUN 12 10/13/2017   CREATININE 1.02 11/01/2018 1122   CREATININE 1.00 10/24/2018 1438   GLU 95 10/13/2017      Component Value Date/Time   CALCIUM 9.1 11/01/2018 1122   ALKPHOS 60 02/23/2018 1047   AST 22 10/24/2018 1438   ALT 24 10/24/2018 1438   BILITOT 0.5 10/24/2018 1438     Lab Results  Component Value Date   CHOL 112 07/29/2016   HDL 34 (A) 07/29/2016   LDLCALC 42 07/29/2016   LDLDIRECT 57 10/24/2018   TRIG 116 07/29/2016      Assessment and Plan: 48 y.o. male with  Hypertension/CAD.  Blood pressure and cholesterol well controlled.  Plan to continue current regimen and recheck in about 6 months with new PCP.  Likely will need labs at that point.  Nipple pain with mass.  Unclear etiology likely cyst.  Gynecomastia is a possibility given his spironolactone use.  Plan to proceed with diagnostic mammogram and ultrasound.  Knee pain: Watchful waiting per patient request.  We will write letter to Dr. Domenica Fail asking him to weigh in on cardiology safety for colonoscopy and send response to Dr. Bryan Lemma.  PDMP not reviewed this encounter. Orders Placed This Encounter  Procedures  . MM Digital Diagnostic Unilat R    Standing Status:   Future    Standing Expiration Date:   05/26/2020    Scheduling Instructions:     Include diagnostic ultrasound if needed.          Patient lives in [place].  Preferably imaging location closer to home.    Order Specific Question:   Reason for Exam (SYMPTOM  OR DIAGNOSIS REQUIRED)    Answer:   Right nipple mass    Order Specific Question:   Preferred imaging location?    Answer:   External   Meds ordered this encounter  Medications  . atorvastatin (LIPITOR) 80 MG tablet    Sig: Take 1 tablet (80 mg  total) by mouth daily. For cholesterol    Dispense:  90 tablet    Refill:  1  . carvedilol (COREG) 6.25 MG tablet    Sig: Take 1 tablet (6.25 mg total) by mouth 2 (two) times daily. For heart and blood pressure    Dispense:  180 tablet    Refill:  1  . clopidogrel (PLAVIX) 75 MG tablet    Sig: Take 1 tablet (75 mg total) by mouth daily. For stents    Dispense:  90 tablet    Refill:  1  . furosemide (LASIX) 20 MG tablet    Sig: TAKE 1 TABLET BY MOUTH DAILY FOR FLUID PILLS    Dispense:  90 tablet    Refill:  1  . pantoprazole (PROTONIX) 40 MG tablet    Sig: Take 1 tablet (40 mg total) by mouth daily. For acid reflux    Dispense:  90 tablet    Refill:  1  . spironolactone (ALDACTONE) 25 MG tablet    Sig: Take 1 tablet (25 mg total) by mouth daily. For heart    Dispense:  90 tablet    Refill:  1     Historical information moved to improve visibility of documentation.  Past Medical History:  Diagnosis Date  . AICD (automatic cardioverter/defibrillator) present 2018   Biotronik  . Chronic systolic heart failure ([place]) 10/24/2018  . Coronary artery disease    2017 - 1 stent. 5 in October 2018  . Dyspnea    WITH EXERTION  . GERD (gastroesophageal reflux disease)   . Hypertension   . Myocardial infarction ([place])     X 1 2017x 2 in 02/2017  . Peripheral vascular disease ([place]) 20 YRS AGO   DVT in right leg   Past Surgical History:  Procedure Laterality Date  . CARDIAC CATHETERIZATION    . CHOLECYSTECTOMY    . CORONARY ANGIOPLASTY    . ICD IMPLANT  2018   BIOTRONIK  . KNEE ARTHROSCOPY Right    x 2  . NASAL SEPTUM SURGERY      x 3  . SHOULDER ARTHROSCOPY Left   . SHOULDER ARTHROSCOPY WITH ROTATOR CUFF REPAIR Right 11/05/2018   Procedure: SHOULDER ARTHROSCOPY   foreign body removal;  Surgeon: Netta Cedars, MD;  Location: [facility];  Service: Orthopedics;  Laterality: Right;  . SHOULDER ARTHROSCOPY WITH ROTATOR CUFF REPAIR AND SUBACROMIAL DECOMPRESSION Right 02/23/2018   Procedure:  RIGHT SHOULDER ARTHROSCOPY WITH OPEN ROTATOR CUFF REPAIR, SUBACROMIAL DECOMPRESSION, OPEN DISTAL CLAVICLE RESECTION, OPEN BICEP TENODESIS, LABRAL DEBRIDEMENT;  Surgeon: Netta Cedars, MD;  Location: [facility];  Service: Orthopedics;  Laterality: Right;  . STENTS TO HEART X 6  1 X 2017 5 IN  2018  . WRIST SURGERY Right    Social History   Tobacco Use  . Smoking status: Former Smoker    Types: Cigarettes  . Smokeless tobacco: Current User    Types: Snuff  . Tobacco comment: quit 2-3 years ago  Substance Use Topics  . Alcohol use: Yes    Comment:  occasional   family history includes Heart attack in his father.  Medications: Current Outpatient Medications  Medication Sig Dispense Refill  . aspirin EC 81 MG tablet Take 81 mg by mouth daily.    Marland Kitchen atorvastatin (LIPITOR) 80 MG tablet Take 1 tablet (80 mg total) by mouth daily. For cholesterol 90 tablet 1  . carvedilol (COREG) 6.25 MG tablet Take 1 tablet (6.25 mg total) by mouth 2 (two) times daily. For heart and blood pressure 180 tablet 1  . clopidogrel (PLAVIX) 75 MG tablet Take 1 tablet (75 mg total) by mouth daily. For stents 90 tablet 1  . diclofenac sodium (VOLTAREN) 1 % GEL Apply 4 g topically 4 (four) times daily. To affected joint. 100 g 11  . furosemide (LASIX) 20 MG tablet TAKE 1 TABLET BY MOUTH DAILY FOR FLUID PILLS 90 tablet 1  . GOODYS EXTRA STRENGTH 520-260-32.5 MG PACK Take 1 packet by mouth 3 (three) times daily as needed (for headaches/toothaches.).    Marland Kitchen methocarbamol (ROBAXIN) 500 MG tablet Take 1 tablet (500 mg total) by mouth 3 (three) times daily as needed. 40 tablet 1  . nitroGLYCERIN (NITROSTAT) 0.4 MG SL tablet Place 0.4 mg under the tongue every 5 (five) minutes as needed for chest pain.     Marland Kitchen oxyCODONE-acetaminophen (PERCOCET) 5-325 MG tablet Take 1 tablet by mouth every 4 (four) hours as needed for severe pain. 20 tablet 0  . pantoprazole (PROTONIX) 40 MG tablet Take 1 tablet (40 mg total) by mouth daily. For acid  reflux 90 tablet 1  . spironolactone (ALDACTONE) 25 MG tablet Take 1 tablet (25 mg total) by mouth daily. For heart 90 tablet 1   No current facility-administered medications for this visit.    Allergies  Allergen Reactions  . Coconut Flavor Anaphylaxis, Shortness Of Breath and Swelling     Discussed warning signs or symptoms. Please see discharge instructions. Patient expresses understanding.

## 2019-03-27 NOTE — Patient Instructions (Signed)
Thank you for coming in today. You should hear soon about diagnostic mammogram near to where you live.  Let me know if you do not hear anything about.  Recheck in 6 months for HTN, Cholesterol with Dr Zigmund Daniel.  Let me know if anything changes.

## 2019-04-01 ENCOUNTER — Telehealth: Payer: Self-pay | Admitting: Family Medicine

## 2019-04-01 LAB — HM MAMMOGRAPHY

## 2019-04-01 NOTE — Telephone Encounter (Signed)
Kim from [facility] called and wanted a verbal order from 03/29/2019 for this patient to have a bilateral mammogram. I gave the verbal and wanted to make you aware. FYI.

## 2019-04-02 NOTE — Telephone Encounter (Signed)
Thank you Barnet Pall. I appreciate it.

## 2019-04-03 ENCOUNTER — Other Ambulatory Visit: Payer: Self-pay | Admitting: Gastroenterology

## 2019-04-03 ENCOUNTER — Telehealth: Payer: Self-pay

## 2019-04-03 DIAGNOSIS — Z1211 Encounter for screening for malignant neoplasm of colon: Secondary | ICD-10-CM

## 2019-04-03 MED ORDER — CLENPIQ 10-3.5-12 MG-GM -GM/160ML PO SOLN: 1.0000 | Freq: Once | ORAL | 0 refills | Status: AC

## 2019-04-03 NOTE — Telephone Encounter (Signed)
Spoke to patient who has agreed to proceed with his colonoscopy scheduled at [facility] on 05/07/2019 at 8:30 am. He received cardiac clearance to hold his plavix 5 days prior to procedure from Dr Maryln Gottron in media) Verbal instructions explained in detail over the phone,and mailed to patient. All questions answered. Patient voiced understanding. He will call prior to his procedure if he has any questions.

## 2019-04-11 ENCOUNTER — Encounter: Payer: Self-pay | Admitting: Family Medicine

## 2019-04-22 ENCOUNTER — Ambulatory Visit: Payer: 59 | Admitting: Family Medicine

## 2019-05-03 ENCOUNTER — Ambulatory Visit (INDEPENDENT_AMBULATORY_CARE_PROVIDER_SITE_OTHER): Payer: 59 | Admitting: Sports Medicine

## 2019-05-03 ENCOUNTER — Encounter: Payer: Self-pay | Admitting: Sports Medicine

## 2019-05-03 ENCOUNTER — Encounter (HOSPITAL_COMMUNITY): Payer: Self-pay

## 2019-05-03 ENCOUNTER — Other Ambulatory Visit (HOSPITAL_COMMUNITY)
Admission: RE | Admit: 2019-05-03 | Discharge: 2019-05-03 | Disposition: A | Discharge status: Home / Self Care | Payer: 59 | Source: Ambulatory Visit | Attending: Gastroenterology | Admitting: Gastroenterology

## 2019-05-03 ENCOUNTER — Other Ambulatory Visit: Payer: Self-pay

## 2019-05-03 DIAGNOSIS — M1712 Unilateral primary osteoarthritis, left knee: Secondary | ICD-10-CM | POA: Diagnosis not present

## 2019-05-03 DIAGNOSIS — M25562 Pain in left knee: Secondary | ICD-10-CM

## 2019-05-03 DIAGNOSIS — N644 Mastodynia: Secondary | ICD-10-CM | POA: Diagnosis not present

## 2019-05-03 DIAGNOSIS — Z20828 Contact with and (suspected) exposure to other viral communicable diseases: Secondary | ICD-10-CM | POA: Insufficient documentation

## 2019-05-03 DIAGNOSIS — Z01812 Encounter for preprocedural laboratory examination: Secondary | ICD-10-CM | POA: Diagnosis present

## 2019-05-03 DIAGNOSIS — G8929 Other chronic pain: Secondary | ICD-10-CM

## 2019-05-03 MED ORDER — PREDNISONE 50 MG PO TABS: ORAL_TABLET | ORAL | 0 refills | Status: DC

## 2019-05-03 MED ORDER — TRAMADOL HCL 50 MG PO TABS: 50.0000 mg | ORAL_TABLET | Freq: Three times a day (TID) | ORAL | 0 refills | Status: DC | PRN

## 2019-05-03 MED ORDER — MELOXICAM 15 MG PO TABS: ORAL_TABLET | ORAL | 3 refills | Status: DC

## 2019-05-03 NOTE — Progress Notes (Signed)
Attempted to obtain medical history via telephone, unable to reach at this time. I left a voicemail to return pre surgical testing department's phone call.  

## 2019-05-03 NOTE — Assessment & Plan Note (Signed)
Bone-on-bone, failed injection 2 months ago. Adding an MRI, he will improve with the prednisone and the meloxicam and tramadol but I do think we need to also get him approved for Visco supplementation and consider arthroplasty.

## 2019-05-03 NOTE — Progress Notes (Signed)
Subjective:    CC: Multiple issues  HPI: Breast pain: Present for some time now, he had been worked up by Dr. Georgina Snell, mammogram and ultrasound showed simple asymmetric breast tissue, pain is severe, persistent, localized just lateral to the right nipple, no discharge, no fevers, chills, pain is severe approximately 3 times weekly.  Oral analgesics are not effective right now.  Left knee pain: X-rays show bone-on-bone osteoarthritis, medial joint line, he had an injection 2 months ago without much relief.  I reviewed the past medical history, family history, social history, surgical history, and allergies today and no changes were needed.  Please see the problem list section below in epic for further details.  Past Medical History: Past Medical History:  Diagnosis Date  . AICD (automatic cardioverter/defibrillator) present 2018   Biotronik  . Chronic systolic heart failure ([place]) 10/24/2018  . Coronary artery disease    2017 - 1 stent. 5 in October 2018  . Dyspnea    WITH EXERTION  . GERD (gastroesophageal reflux disease)   . Hypertension   . Myocardial infarction ([place])     X 1 2017x 2 in 02/2017  . Peripheral vascular disease ([place]) 20 YRS AGO   DVT in right leg   Past Surgical History: Past Surgical History:  Procedure Laterality Date  . CARDIAC CATHETERIZATION    . CHOLECYSTECTOMY    . CORONARY ANGIOPLASTY    . ICD IMPLANT  2018   BIOTRONIK  . KNEE ARTHROSCOPY Right    x 2  . NASAL SEPTUM SURGERY      x 3  . SHOULDER ARTHROSCOPY Left   . SHOULDER ARTHROSCOPY WITH ROTATOR CUFF REPAIR Right 11/05/2018   Procedure: SHOULDER ARTHROSCOPY   foreign body removal;  Surgeon: Netta Cedars, MD;  Location: [facility];  Service: Orthopedics;  Laterality: Right;  . SHOULDER ARTHROSCOPY WITH ROTATOR CUFF REPAIR AND SUBACROMIAL DECOMPRESSION Right 02/23/2018   Procedure: RIGHT SHOULDER ARTHROSCOPY WITH OPEN ROTATOR CUFF REPAIR, SUBACROMIAL DECOMPRESSION, OPEN DISTAL CLAVICLE RESECTION, OPEN  BICEP TENODESIS, LABRAL DEBRIDEMENT;  Surgeon: Netta Cedars, MD;  Location: [facility];  Service: Orthopedics;  Laterality: Right;  . STENTS TO HEART X 6  1 X 2017 5 IN 2018  . WRIST SURGERY Right    Social History: Social History   Socioeconomic History  . Marital status: Significant Other    Spouse name: Not on file  . Number of children: Not on file  . Years of education: Not on file  . Highest education level: Not on file  Occupational History  . Not on file  Social Needs  . Financial resource strain: Not on file  . Food insecurity    Worry: Not on file    Inability: Not on file  . Transportation needs    Medical: Not on file    Non-medical: Not on file  Tobacco Use  . Smoking status: Former Smoker    Types: Cigarettes  . Smokeless tobacco: Current User    Types: Snuff  . Tobacco comment: quit 2-3 years ago  Substance and Sexual Activity  . Alcohol use: Yes    Comment:  occasional  . Drug use: Not Currently  . Sexual activity: Not on file  Lifestyle  . Physical activity    Days per week: Not on file    Minutes per session: Not on file  . Stress: Not on file  Relationships  . Social Herbalist on phone: Not on file    Gets together: Not on  file    Attends religious service: Not on file    Active member of club or organization: Not on file    Attends meetings of clubs or organizations: Not on file    Relationship status: Not on file  Other Topics Concern  . Not on file  Social History Narrative  . Not on file   Family History: Family History  Problem Relation Age of Onset  . Heart attack Father   . Colon cancer Neg Hx    Allergies: Allergies  Allergen Reactions  . Coconut Flavor Anaphylaxis, Shortness Of Breath and Swelling   Medications: See med rec.  Review of Systems: No fevers, chills, night sweats, weight loss, chest pain, or shortness of breath.   Objective:    General: Well Developed, well nourished, and in no acute distress.   Neuro: Alert and oriented x3, extra-ocular muscles intact, sensation grossly intact.  HEENT: Normocephalic, atraumatic, pupils equal round reactive to light, neck supple, no masses, no lymphadenopathy, thyroid nonpalpable.  Skin: Warm and dry, no rashes. Cardiac: Regular rate and rhythm, no murmurs rubs or gallops, no lower extremity edema.  Respiratory: Clear to auscultation bilaterally. Not using accessory muscles, speaking in full sentences. Chest wall: Exquisite tenderness over the right breast, fullness to palpation, no peau d'orange, no nipple retraction, no nipple discharge. Left knee: Normal to inspection with no erythema or effusion or obvious bony abnormalities. Severe tenderness to the medial joint line ROM normal in flexion and extension and lower leg rotation. Ligaments with solid consistent endpoints including ACL, PCL, LCL, MCL. Negative Mcmurray's and provocative meniscal tests. Non painful patellar compression. Patellar and quadriceps tendons unremarkable. Hamstring and quadriceps strength is normal.  Impression and Recommendations:    Breast pain, right Severe right breast pain, just lateral to the nipple at the 9 o'clock position. He has already had ultrasound/mammogram that simply showed asymmetric glandular tissue. Adding prednisone, meloxicam, considering he is having flares of this pain several times a week it I think is acceptable to go ahead and refer out for a mastectomy. Referral to plastic surgery.  Primary osteoarthritis of left knee Bone-on-bone, failed injection 2 months ago. Adding an MRI, he will improve with the prednisone and the meloxicam and tramadol but I do think we need to also get him approved for Visco supplementation and consider arthroplasty.    ___________________________________________ Gwen Her. Dianah Field, M.D., ABFM., CAQSM. Primary Care and Sports Medicine [facility]  Adjunct Professor of [employer]

## 2019-05-03 NOTE — Assessment & Plan Note (Addendum)
Severe right breast pain, just lateral to the nipple at the 9 o'clock position. He has already had ultrasound/mammogram that simply showed asymmetric glandular tissue. Adding prednisone, meloxicam, considering he is having flares of this pain several times a week it I think is acceptable to go ahead and refer out for a mastectomy. Referral to plastic surgery.

## 2019-05-04 LAB — NOVEL CORONAVIRUS, NAA (HOSP ORDER, SEND-OUT TO REF LAB; TAT 18-24 HRS): SARS-CoV-2, NAA: NOT DETECTED

## 2019-05-07 ENCOUNTER — Ambulatory Visit (HOSPITAL_COMMUNITY)
Admission: RE | Admit: 2019-05-07 | Discharge: 2019-05-07 | Disposition: A | Discharge status: Home / Self Care | Payer: 59 | Attending: Gastroenterology | Admitting: Gastroenterology

## 2019-05-07 ENCOUNTER — Other Ambulatory Visit: Payer: Self-pay

## 2019-05-07 ENCOUNTER — Ambulatory Visit (HOSPITAL_COMMUNITY): Payer: 59 | Admitting: Certified Registered"

## 2019-05-07 ENCOUNTER — Encounter (HOSPITAL_COMMUNITY)
Admission: RE | Disposition: A | Discharge status: Home / Self Care | Payer: Self-pay | Source: Home / Self Care | Attending: Gastroenterology

## 2019-05-07 ENCOUNTER — Encounter (HOSPITAL_COMMUNITY): Payer: Self-pay | Admitting: *Deleted

## 2019-05-07 DIAGNOSIS — I2510 Atherosclerotic heart disease of native coronary artery without angina pectoris: Secondary | ICD-10-CM | POA: Diagnosis not present

## 2019-05-07 DIAGNOSIS — Z955 Presence of coronary angioplasty implant and graft: Secondary | ICD-10-CM | POA: Insufficient documentation

## 2019-05-07 DIAGNOSIS — Z7902 Long term (current) use of antithrombotics/antiplatelets: Secondary | ICD-10-CM | POA: Diagnosis not present

## 2019-05-07 DIAGNOSIS — Z7982 Long term (current) use of aspirin: Secondary | ICD-10-CM | POA: Diagnosis not present

## 2019-05-07 DIAGNOSIS — I739 Peripheral vascular disease, unspecified: Secondary | ICD-10-CM | POA: Diagnosis not present

## 2019-05-07 DIAGNOSIS — K219 Gastro-esophageal reflux disease without esophagitis: Secondary | ICD-10-CM | POA: Insufficient documentation

## 2019-05-07 DIAGNOSIS — I252 Old myocardial infarction: Secondary | ICD-10-CM | POA: Diagnosis not present

## 2019-05-07 DIAGNOSIS — Z87891 Personal history of nicotine dependence: Secondary | ICD-10-CM | POA: Insufficient documentation

## 2019-05-07 DIAGNOSIS — Z86718 Personal history of other venous thrombosis and embolism: Secondary | ICD-10-CM | POA: Insufficient documentation

## 2019-05-07 DIAGNOSIS — I110 Hypertensive heart disease with heart failure: Secondary | ICD-10-CM | POA: Diagnosis not present

## 2019-05-07 DIAGNOSIS — Z1211 Encounter for screening for malignant neoplasm of colon: Secondary | ICD-10-CM

## 2019-05-07 DIAGNOSIS — D122 Benign neoplasm of ascending colon: Secondary | ICD-10-CM | POA: Diagnosis not present

## 2019-05-07 DIAGNOSIS — K648 Other hemorrhoids: Secondary | ICD-10-CM | POA: Diagnosis not present

## 2019-05-07 DIAGNOSIS — J449 Chronic obstructive pulmonary disease, unspecified: Secondary | ICD-10-CM | POA: Insufficient documentation

## 2019-05-07 DIAGNOSIS — K5730 Diverticulosis of large intestine without perforation or abscess without bleeding: Secondary | ICD-10-CM | POA: Diagnosis not present

## 2019-05-07 DIAGNOSIS — I5022 Chronic systolic (congestive) heart failure: Secondary | ICD-10-CM | POA: Insufficient documentation

## 2019-05-07 DIAGNOSIS — K635 Polyp of colon: Secondary | ICD-10-CM | POA: Diagnosis not present

## 2019-05-07 DIAGNOSIS — K621 Rectal polyp: Secondary | ICD-10-CM

## 2019-05-07 DIAGNOSIS — Z95810 Presence of automatic (implantable) cardiac defibrillator: Secondary | ICD-10-CM | POA: Insufficient documentation

## 2019-05-07 HISTORY — PX: BIOPSY: SHX5522

## 2019-05-07 HISTORY — PX: POLYPECTOMY: SHX5525

## 2019-05-07 HISTORY — PX: COLONOSCOPY WITH PROPOFOL: SHX5780

## 2019-05-07 HISTORY — DX: Acute embolism and thrombosis of unspecified deep veins of unspecified lower extremity: I82.409

## 2019-05-07 SURGERY — COLONOSCOPY WITH PROPOFOL
Anesthesia: Monitor Anesthesia Care

## 2019-05-07 MED ORDER — PROPOFOL 10 MG/ML IV BOLUS
INTRAVENOUS | Status: DC | PRN
  Administered 2019-05-07 (×2): 20 mg via INTRAVENOUS

## 2019-05-07 MED ORDER — SODIUM CHLORIDE 0.9 % IV SOLN: INTRAVENOUS | Status: DC

## 2019-05-07 MED ORDER — PROPOFOL 10 MG/ML IV BOLUS
INTRAVENOUS | Status: AC
  Filled 2019-05-07: qty 40

## 2019-05-07 MED ORDER — LACTATED RINGERS IV SOLN
INTRAVENOUS | Status: DC
  Administered 2019-05-07: 07:00:00 via INTRAVENOUS

## 2019-05-07 MED ORDER — PROPOFOL 500 MG/50ML IV EMUL
INTRAVENOUS | Status: AC
  Filled 2019-05-07: qty 50

## 2019-05-07 MED ORDER — PROPOFOL 500 MG/50ML IV EMUL
INTRAVENOUS | Status: DC | PRN
  Administered 2019-05-07: 125 ug/kg/min via INTRAVENOUS

## 2019-05-07 SURGICAL SUPPLY — 21 items

## 2019-05-07 NOTE — Discharge Instructions (Signed)
YOU HAD AN ENDOSCOPIC PROCEDURE TODAY: Refer to the procedure report and other information in the discharge instructions given to you for any specific questions about what was found during the examination. If this information does not answer your questions, please call [facility] [place] office at [phone] to clarify.  ° °YOU SHOULD EXPECT: Some feelings of bloating in the abdomen. Passage of more gas than usual. Walking can help get rid of the air that was put into your GI tract during the procedure and reduce the bloating. If you had a lower endoscopy (such as a colonoscopy or flexible sigmoidoscopy) you may notice spotting of blood in your stool or on the toilet paper. Some abdominal soreness may be present for a day or two, also. ° °DIET: Your first meal following the procedure should be a light meal and then it is ok to progress to your normal diet. A half-sandwich or bowl of soup is an example of a good first meal. Heavy or fried foods are harder to digest and may make you feel nauseous or bloated. Drink plenty of fluids but you should avoid alcoholic beverages for 24 hours. If you had a esophageal dilation, please see attached instructions for diet.   ° °ACTIVITY: Your care partner should take you home directly after the procedure. You should plan to take it easy, moving slowly for the rest of the day. You can resume normal activity the day after the procedure however YOU SHOULD NOT DRIVE, use power tools, machinery or perform tasks that involve climbing or major physical exertion for 24 hours (because of the sedation medicines used during the test).  ° °SYMPTOMS TO REPORT IMMEDIATELY: °A gastroenterologist can be reached at any hour. Please call [phone]  for any of the following symptoms:  °Following lower endoscopy (colonoscopy, flexible sigmoidoscopy) °Excessive amounts of blood in the stool  °Significant tenderness, worsening of abdominal pains  °Swelling of the abdomen that is new, acute  °Fever of 100° or  higher  °Following upper endoscopy (EGD, EUS, ERCP, esophageal dilation) °Vomiting of blood or coffee ground material  °New, significant abdominal pain  °New, significant chest pain or pain under the shoulder blades  °Painful or persistently difficult swallowing  °New shortness of breath  °Black, tarry-looking or red, bloody stools ° °FOLLOW UP:  °If any biopsies were taken you will be contacted by phone or by letter within the next 1-3 weeks. Call [phone]  if you have not heard about the biopsies in 3 weeks.  °Please also call with any specific questions about appointments or follow up tests. ° °

## 2019-05-07 NOTE — Anesthesia Procedure Notes (Signed)
Procedure Name: MAC Date/Time: 05/07/2019 8:21 AM Performed by: Eben Burow, CRNA Pre-anesthesia Checklist: Patient identified, Emergency Drugs available, Suction available, Patient being monitored and Timeout performed Oxygen Delivery Method: Simple face mask Dental Injury: Teeth and Oropharynx as per pre-operative assessment

## 2019-05-07 NOTE — H&P (Signed)
P  Chief Complaint:    Colon cancer screening  GI History: Shawn Burke is a 48 y.o. male with a history of hypertension, CAD (STEMI 03/2016, STEMI 02/2017 c/b VF arrest; STEMI 03/2017), PVD, heart failure, ICD in place, and GERD presenting today for colonoscopy for routine, age-appropriate CRC screening.    Was initially seen in the GI clinic in 11/2018, and schedule for colonoscopy to be done at [facility] given significant cardiovascular history, to include heart failure with EF 30%.  No previous CRC screening.  No known personal or family history of GI malignancy, IBD.  He is otherwise without GI symptoms, and denies hematochezia, melena, change in bowel habits, nausea, vomiting, fever, chills, weight loss.  He does have significant cardiovascular disease to include STEMI requiring LAD stent 03/2016, STEMI d/t pRCA thrombus, s/p DES RCA and PTCA mPDA c/b VF arrest 02/2017; STEMI d/t occluded small distal PDA 03/2017 s/p DES to oLAD and mLAD.  Nuclear stress test in 06/2017 at [facility] with fixed, large size, severe great perfusion defect involving the apical and periapical segments indicating prior MI.  EF 30%. Currently taking ASA 81 mg and Plavix.    Did recently undergo right shoulder arthroscopy in 10/2018 without issues with anesthesia (regional and general). Held ASA and Plavix x5 days prior to surgery. Follows with Dr. Opal Sidles at [facility] in [place], [place].    Separately, he has a longstanding history of reflux, which is generally well controlled with Protonix 40 mg/day.  HPI:     Patient is a 48 y.o. male presenting to [facility] for colonoscopy for routine colon cancer screening, as outlined above.  Since his last appointment, no significant change in clinical history.   Review of systems:     No chest pain, no SOB, no fevers, no urinary sx   Past Medical History:  Diagnosis Date  . AICD (automatic cardioverter/defibrillator) present 2018   Biotronik  . Chronic  systolic heart failure ([place]) 10/24/2018  . Coronary artery disease    2017 - 1 stent. 5 in October 2018  . DVT (deep venous thrombosis) ([facility])    Right leg  . Dyspnea    WITH EXERTION  . GERD (gastroesophageal reflux disease)   . Hypertension   . Myocardial infarction ([place])     X 1 2017x 2 in 02/2017 v. fib defib  . Peripheral vascular disease ([place]) 20 YRS AGO   DVT in right leg    Patient's surgical history, family medical history, social history, medications and allergies were all reviewed in Epic    Current Facility-Administered Medications  Medication Dose Route Frequency Provider Last Rate Last Dose  . 0.9 %  sodium chloride infusion   Intravenous Continuous Pranit Owensby V, DO      . lactated ringers infusion   Intravenous Continuous Usha Slager V, DO 10 mL/hr at 05/07/19 0727      Physical Exam:     BP 140/82   Pulse 81   Temp 98.4 F (36.9 C) (Oral)   Resp 12   Ht 5\' 10"  (1.778 m)   Wt 95.7 kg   SpO2 95%   BMI 30.28 kg/m   GENERAL:  Pleasant male in NAD PSYCH: : Cooperative, normal affect EENT:  conjunctiva pink, mucous membranes moist, neck supple without masses CARDIAC:  RRR, no murmur heard, no peripheral edema PULM: Normal respiratory effort, lungs CTA bilaterally, no wheezing ABDOMEN:  Nondistended, soft, nontender. No obvious masses, no hepatomegaly,  normal bowel  sounds SKIN:  turgor, no lesions seen Musculoskeletal:  Normal muscle tone, normal strength NEURO: Alert and oriented x 3, no focal neurologic deficits   IMPRESSION and PLAN:     1) CRC screening: -Colonoscopy today at [facility]  2) CAD/history of MI 3) Systemic antiplatelet therapy 4) CHF  -Held Plavix x5 days prior to procedure.  Still on ASA 81 mg -Timing to resume Plavix pending findings on colonoscopy -To follow-up with his Cardiologist as previously planned  5) GERD: -Well-controlled on current PPI therapy -Resume antireflux lifestyle measures          Lavena Bullion ,DO, FACG 05/07/2019, 7:48 AM

## 2019-05-07 NOTE — Interval H&P Note (Signed)
History and Physical Interval Note:  05/07/2019 7:53 AM  Shawn Burke  has presented today for surgery, with the diagnosis of colon cancer screening.  The various methods of treatment have been discussed with the patient and family. After consideration of risks, benefits and other options for treatment, the patient has consented to  Procedure(s): COLONOSCOPY WITH PROPOFOL (N/A) as a surgical intervention.  The patient's history has been reviewed, patient examined, no change in status, stable for surgery.  I have reviewed the patient's chart and labs.  Questions were answered to the patient's satisfaction.     [name]

## 2019-05-07 NOTE — Transfer of Care (Signed)
Immediate Anesthesia Transfer of Care Note  Patient: Shawn Burke  Procedure(s) Performed: COLONOSCOPY WITH PROPOFOL (N/A ) BIOPSY  Patient Location: PACU and Endoscopy Unit  Anesthesia Type:MAC  Level of Consciousness: awake, drowsy and patient cooperative  Airway & Oxygen Therapy: Patient Spontanous Breathing and Patient connected to face mask oxygen  Post-op Assessment: Report given to RN and Post -op Vital signs reviewed and stable  Post vital signs: Reviewed and stable  Last Vitals:  Vitals Value Taken Time  BP 126/95 05/07/19 0857  Temp    Pulse 66 05/07/19 0858  Resp 17 05/07/19 0858  SpO2 99 % 05/07/19 0858  Vitals shown include unvalidated device data.  Last Pain:  Vitals:   05/07/19 0720  TempSrc: Oral  PainSc: 8          Complications: No apparent anesthesia complications

## 2019-05-07 NOTE — Anesthesia Postprocedure Evaluation (Signed)
Anesthesia Post Note  Patient: Shawn Burke  Procedure(s) Performed: COLONOSCOPY WITH PROPOFOL (N/A ) BIOPSY     Patient location during evaluation: Endoscopy Anesthesia Type: MAC Level of consciousness: awake and alert Pain management: pain level controlled Vital Signs Assessment: post-procedure vital signs reviewed and stable Respiratory status: spontaneous breathing, nonlabored ventilation and respiratory function stable Cardiovascular status: blood pressure returned to baseline and stable Postop Assessment: no apparent nausea or vomiting Anesthetic complications: no    Last Vitals:  Vitals:   05/07/19 0910 05/07/19 0915  BP: 121/90   Pulse: 68 66  Resp: 14 19  Temp:    SpO2: 92% 97%    Last Pain:  Vitals:   05/07/19 0900  TempSrc: Oral  PainSc: 0-No pain                 Lidia Collum

## 2019-05-07 NOTE — Anesthesia Preprocedure Evaluation (Signed)
Anesthesia Evaluation  Patient identified by MRN, date of birth, ID band Patient awake    Reviewed: Allergy & Precautions, NPO status , Patient's Chart, lab work & pertinent test results  History of Anesthesia Complications Negative for: history of anesthetic complications  Airway Mallampati: II  TM Distance: >3 FB Neck ROM: Full    Dental  (+) Teeth Intact   Pulmonary shortness of breath and with exertion, COPD, former smoker,    Pulmonary exam normal        Cardiovascular hypertension, + CAD, + Past MI, + Peripheral Vascular Disease, +CHF and + DVT  Normal cardiovascular exam+ dysrhythmias Ventricular Fibrillation + Cardiac Defibrillator      Neuro/Psych negative neurological ROS  negative psych ROS   GI/Hepatic Neg liver ROS, GERD  ,  Endo/Other  negative endocrine ROS  Renal/GU negative Renal ROS  negative genitourinary   Musculoskeletal negative musculoskeletal ROS (+)   Abdominal   Peds  Hematology negative hematology ROS (+)   Anesthesia Other Findings Echo 2018: EF 25-30%  Reproductive/Obstetrics                             Anesthesia Physical Anesthesia Plan  ASA: IV  Anesthesia Plan: MAC   Post-op Pain Management:    Induction: Intravenous  PONV Risk Score and Plan: 1 and Propofol infusion, TIVA and Treatment may vary due to age or medical condition  Airway Management Planned: Natural Airway, Nasal Cannula and Simple Face Mask  Additional Equipment: None  Intra-op Plan:   Post-operative Plan:   Informed Consent: I have reviewed the patients History and Physical, chart, labs and discussed the procedure including the risks, benefits and alternatives for the proposed anesthesia with the patient or authorized representative who has indicated his/her understanding and acceptance.       Plan Discussed with:   Anesthesia Plan Comments:         Anesthesia  Quick Evaluation

## 2019-05-07 NOTE — Op Note (Signed)
[facility] Patient Name: Shawn Burke Procedure Date: 05/07/2019 MRN: [id number] Attending MD: Gerrit Heck , MD Date of Birth: May 08, 1971 CSN: [id number] Age: 48 Admit Type: Outpatient Procedure:                Colonoscopy Indications:              Screening for colorectal malignant neoplasm, This                            is the patient's first colonoscopy. He is otherwise                            without lower GI symptoms and no known family                            history of colon cancer. Providers:                Gerrit Heck, MD, Jobe Igo, RN, Cherylynn Ridges, Technician,Good,Melanie CRNA Referring MD:              Medicines:                Monitored Anesthesia Care Complications:            No immediate complications. Estimated Blood Loss:     Estimated blood loss was minimal. Procedure:                Pre-Anesthesia Assessment:                           - Prior to the procedure, a History and Physical                            was performed, and patient medications and                            allergies were reviewed. The patient's tolerance of                            previous anesthesia was also reviewed. The risks                            and benefits of the procedure and the sedation                            options and risks were discussed with the patient.                            All questions were answered, and informed consent                            was obtained. Prior Anticoagulants: The patient has  taken Plavix (clopidogrel), last dose was 5 days                            prior to procedure. ASA Grade Assessment: III - A                            patient with severe systemic disease. After                            reviewing the risks and benefits, the patient was                            deemed in satisfactory condition to undergo the         procedure.                           After obtaining informed consent, the colonoscope                            was passed under direct vision. Throughout the                            procedure, the patient's blood pressure, pulse, and                            oxygen saturations were monitored continuously. The                            [id number] [id number] Olympus colonoscope was                            introduced through the anus and advanced to the the                            terminal ileum. The colonoscopy was performed                            without difficulty. The patient tolerated the                            procedure well. The quality of the bowel                            preparation was good. The terminal ileum, ileocecal                            valve, appendiceal orifice, and rectum were                            photographed. Scope In: 8:30:08 AM Scope Out: 8:49:14 AM Scope Withdrawal Time: 0 hours 17 minutes 15 seconds  Total Procedure Duration: 0 hours 19 minutes 6 seconds  Findings:      The perianal and digital rectal examinations were normal.      A 10  mm polyp was found in the ascending colon. The polyp was sessile.       The polyp was removed with a cold snare. Resection and retrieval were       complete. Estimated blood loss was minimal.      Two sessile polyps were found in the recto-sigmoid colon. The polyps       were 2 to 3 mm in size. These polyps were removed with a cold biopsy       forceps. Resection and retrieval were complete. Estimated blood loss was       minimal.      A few small-mouthed diverticula were found in the sigmoid colon and       descending colon.      Non-bleeding internal hemorrhoids were found during retroflexion. The       hemorrhoids were small.      The terminal ileum appeared normal. Impression:               - One 10 mm polyp in the ascending colon, removed                            with a cold snare.  Resected and retrieved.                           - Two 2 to 3 mm polyps at the recto-sigmoid colon,                            removed with a cold biopsy forceps. Resected and                            retrieved.                           - Diverticulosis in the sigmoid colon and in the                            descending colon.                           - Non-bleeding internal hemorrhoids.                           - The examined portion of the ileum was normal. Moderate Sedation:      Not Applicable - Patient had care per Anesthesia. Recommendation:           - Patient has a contact number available for                            emergencies. The signs and symptoms of potential                            delayed complications were discussed with the                            patient. Return to normal activities tomorrow.  Written discharge instructions were provided to the                            patient.                           - Resume previous diet.                           - Continue present medications.                           - Await pathology results.                           - Given the size fo the largest polyp, along with                            number of polyps, recommend repeat colonoscopy in 3                            years for surveillance.                           - Resume Plavix (clopidogrel) at prior dose                            tomorrow.                           - Use fiber, for example Citrucel, Fibercon, Konsyl                            or Metamucil.                           - Internal hemorrhoids were noted on this study and                            may be amenable to hemorrhoid band ligation. If you                            are interested in further treatment of these                            hemorrhoids with band ligation, please contact my                            clinic to set up an appointment for  evaluation and                            treatment. Procedure Code(s):        --- Professional ---                           [phone], Colonoscopy, flexible; with removal of  tumor(s), polyp(s), or other lesion(s) by snare                            technique                           45380, 59, Colonoscopy, flexible; with biopsy,                            single or multiple Diagnosis Code(s):        --- Professional ---                           K63.5, Polyp of colon                           Z12.11, Encounter for screening for malignant                            neoplasm of colon                           K64.8, Other hemorrhoids                           K57.30, Diverticulosis of large intestine without                            perforation or abscess without bleeding CPT copyright 2019 American Medical Association. All rights reserved. The codes documented in this report are preliminary and upon coder review may  be revised to meet current compliance requirements. Gerrit Heck, MD 05/07/2019 8:57:29 AM Number of Addenda: 0

## 2019-05-08 LAB — SURGICAL PATHOLOGY

## 2019-05-09 ENCOUNTER — Encounter: Payer: Self-pay | Admitting: Gastroenterology

## 2019-05-10 ENCOUNTER — Encounter: Payer: Self-pay | Admitting: *Deleted

## 2019-05-15 ENCOUNTER — Telehealth: Payer: Self-pay | Admitting: Sports Medicine

## 2019-05-15 NOTE — Telephone Encounter (Signed)
Received a call from [place] at [place] that she would need the model number and the year it was implanted so that the radiology department can research the device before moving forward with the MRI.   I have let Morey Hummingbird at centralized scheduling know that the patient will call her with the information. No other questions at this time.

## 2019-06-07 ENCOUNTER — Telehealth: Payer: Self-pay | Admitting: Sports Medicine

## 2019-06-07 NOTE — Telephone Encounter (Signed)
Per Hildred Alamin at [facility]: Case [id number] (J G 1971-01-23) - [employer] cannot locate the patient by name, DOB, or by ID number. This is not a covered benefit. Closing encounter.

## 2019-06-07 NOTE — Telephone Encounter (Signed)
-----   Message from Tasia Catchings, [place] sent at 05/15/2019  4:22 PM EST ----- Information has been submitted to [facility] and awaiting determination.    ----- Message ----- From: Silverio Decamp, MD Sent: 05/03/2019  11:30 AM EST To: Beatris Ship Ragena Fiola, CMA  [facility] approval, left knee ___________________________________________Thomas J. Dianah Field, M.D., ABFM., CAQSM.Primary Care and Sports MedicineCone Health MedCenter KernersvilleAdjunct Professor of [place] of [facility] of Medicine

## 2019-06-14 DIAGNOSIS — I82409 Acute embolism and thrombosis of unspecified deep veins of unspecified lower extremity: Secondary | ICD-10-CM | POA: Insufficient documentation

## 2019-06-14 NOTE — Telephone Encounter (Signed)
Just have him get [place] the name and model number of his defibrillator and I can look it up.  He should have a card in his wallet.  I am not going to just refer him to a different cardiologist.  Another option would be to do a CT arthrogram of the knee if he is unable to get the name and model number of his ICD.

## 2019-06-14 NOTE — Telephone Encounter (Signed)
It is his responsibility to contact his Cardiologist and simply find out the name and model.  His cardiologist is at [facility]

## 2019-08-27 ENCOUNTER — Other Ambulatory Visit: Payer: Self-pay | Admitting: *Deleted

## 2019-08-27 DIAGNOSIS — N644 Mastodynia: Secondary | ICD-10-CM

## 2019-08-27 MED ORDER — MELOXICAM 15 MG PO TABS: ORAL_TABLET | ORAL | 3 refills | Status: DC

## 2019-09-20 ENCOUNTER — Telehealth (INDEPENDENT_AMBULATORY_CARE_PROVIDER_SITE_OTHER): Payer: 59 | Admitting: Sports Medicine

## 2019-09-20 ENCOUNTER — Encounter: Payer: Self-pay | Admitting: Sports Medicine

## 2019-09-20 DIAGNOSIS — J019 Acute sinusitis, unspecified: Secondary | ICD-10-CM

## 2019-09-20 MED ORDER — TRIAMCINOLONE ACETONIDE 55 MCG/ACT NA AERO: 2.0000 | INHALATION_SPRAY | Freq: Every day | NASAL | 11 refills | Status: DC

## 2019-09-20 MED ORDER — AZITHROMYCIN 250 MG PO TABS: ORAL_TABLET | ORAL | 0 refills | Status: DC

## 2019-09-20 NOTE — Progress Notes (Signed)
   Virtual Visit via WebEx/MyChart   I connected with  Shawn Burke  on 09/20/19 via WebEx/MyChart/Doximity Video and verified that I am speaking with the correct person using two identifiers.   I discussed the limitations, risks, security and privacy concerns of performing an evaluation and management service by WebEx/MyChart/Doximity Video, including the higher likelihood of inaccurate diagnosis and treatment, and the availability of in person appointments.  We also discussed the likely need of an additional face to face encounter for complete and high quality delivery of care.  I also discussed with the patient that there may be a patient responsible charge related to this service. The patient expressed understanding and wishes to proceed.  Provider location is either at home or medical facility. Patient location is at their home, different from provider location. People involved in care of the patient during this telehealth encounter were myself, my nurse/medical assistant, and my front office/scheduling team member.  Review of Systems: No fevers, chills, night sweats, weight loss, chest pain, or shortness of breath.   Objective Findings:    General: Speaking full sentences, no audible heavy breathing.  Sounds alert and appropriately interactive.  Appears well.  Face symmetric.  Extraocular movements intact.  Pupils equal and round.  No nasal flaring or accessory muscle use visualized.  Independent interpretation of tests performed by another provider:   None.  Brief History, Exam, Impression, and Recommendations:    Acute rhinosinusitis This is a pleasant 49 year old male with history of systolic heart failure, he got his COVID-19 vaccine about 3 weeks ago, several days after that he developed some aches, night sweats, sinus pain and pressure with purulent nasal discharge, as well as significant clearish discharge as well. Mild coughing. He endorses mild shortness of breath but  he is speaking in full sentences on the phone, his worst symptom is his sinus pressure and nasal mucus production. He likely had the expected inflammatory response symptoms post his Covid vaccine and developed a bacterial superinfection. As his symptoms have now been present for about 2 weeks and have not responded to Flonase we are going to start with a course of azithromycin, and switch to Nasacort. I would like 1 of [place] to see him back in the office if no better in about a week.   I discussed the above assessment and treatment plan with the patient. The patient was provided an opportunity to ask questions and all were answered. The patient agreed with the plan and demonstrated an understanding of the instructions.   The patient was advised to call back or seek an in-person evaluation if the symptoms worsen or if the condition fails to improve as anticipated.   I provided 30 minutes of face to face and non-face-to-face time during this encounter date, time was needed to gather information, review chart, records, communicate/coordinate with staff remotely, as well as complete documentation.   ___________________________________________ Gwen Her. Dianah Field, M.D., ABFM., CAQSM. Primary Care and [place] Instructor of [place] of [facility] of Medicine

## 2019-09-20 NOTE — Assessment & Plan Note (Signed)
This is a pleasant 49 year old male with history of systolic heart failure, he got his COVID-19 vaccine about 3 weeks ago, several days after that he developed some aches, night sweats, sinus pain and pressure with purulent nasal discharge, as well as significant clearish discharge as well. Mild coughing. He endorses mild shortness of breath but he is speaking in full sentences on the phone, his worst symptom is his sinus pressure and nasal mucus production. He likely had the expected inflammatory response symptoms post his Covid vaccine and developed a bacterial superinfection. As his symptoms have now been present for about 2 weeks and have not responded to Flonase we are going to start with a course of azithromycin, and switch to Nasacort. I would like 1 of [place] to see him back in the office if no better in about a week.

## 2019-09-24 ENCOUNTER — Other Ambulatory Visit: Payer: Self-pay | Admitting: Family Medicine

## 2019-10-06 ENCOUNTER — Other Ambulatory Visit: Payer: Self-pay | Admitting: Family Medicine

## 2019-10-14 ENCOUNTER — Other Ambulatory Visit: Payer: Self-pay | Admitting: Family Medicine

## 2019-10-25 ENCOUNTER — Other Ambulatory Visit: Payer: Self-pay | Admitting: Sports Medicine

## 2019-11-13 ENCOUNTER — Telehealth (INDEPENDENT_AMBULATORY_CARE_PROVIDER_SITE_OTHER): Payer: 59 | Admitting: Family Medicine

## 2019-11-13 ENCOUNTER — Other Ambulatory Visit: Payer: Self-pay

## 2019-11-13 ENCOUNTER — Other Ambulatory Visit: Payer: Self-pay | Admitting: Family Medicine

## 2019-11-13 ENCOUNTER — Encounter: Payer: Self-pay | Admitting: Family Medicine

## 2019-11-13 DIAGNOSIS — J069 Acute upper respiratory infection, unspecified: Secondary | ICD-10-CM | POA: Diagnosis not present

## 2019-11-13 MED ORDER — HYDROCOD POLST-CPM POLST ER 10-8 MG/5ML PO SUER: 5.0000 mL | Freq: Every evening | ORAL | 0 refills | Status: DC | PRN

## 2019-11-13 MED ORDER — PANTOPRAZOLE SODIUM 40 MG PO TBEC: DELAYED_RELEASE_TABLET | ORAL | 0 refills | Status: DC

## 2019-11-13 MED ORDER — PREDNISONE 10 MG (21) PO TBPK: ORAL_TABLET | ORAL | 0 refills | Status: DC

## 2019-11-13 MED ORDER — CLOPIDOGREL BISULFATE 75 MG PO TABS: ORAL_TABLET | ORAL | 0 refills | Status: AC

## 2019-11-13 NOTE — Progress Notes (Signed)
RSV outbreak at work since Saturday. Employees and their spouses have symptoms.  Cough Sore throat; hard to swallow Sinus drainage

## 2019-11-13 NOTE — Assessment & Plan Note (Signed)
Recommend continued supportive care with good fluid intake and rest.  Will add tussionex at night for cough as this is keeping him from sleeping.  Adding prednisone taper for sore throat and laryngitis.  Instructed to call if he develops new or worsening symptoms.

## 2019-11-13 NOTE — Progress Notes (Signed)
Shawn Burke - 49 y.o. male MRN [id number]  Date of birth: 04/08/1971   This visit type was conducted due to national recommendations for restrictions regarding the COVID-19 Pandemic (e.g. social distancing).  This format is felt to be most appropriate for this patient at this time.  All issues noted in this document were discussed and addressed.  No physical exam was performed (except for noted visual exam findings with Video Visits).  I discussed the limitations of evaluation and management by telemedicine and the availability of in person appointments. The patient expressed understanding and agreed to proceed.  I connected with@ on 11/13/19 at  9:30 AM EDT by a video enabled telemedicine application and verified that I am speaking with the correct person using two identifiers.  Present at visit: Luetta Nutting, DO [place]   Patient Location: [place] [address]   Provider location:   [facility]  Chief Complaint  Patient presents with  . Establish Care    HPI  Shawn Burke is a 49 y.o. male who presents via audio/video conferencing for a telehealth visit today.  He has complaint of increased congestion, sore throat, hoarseness, cough, and ear pressure.  Symptoms started about 4 days ago.  Reports several people at work with RSV.  He has not had fever, chills, shortness of breath, nausea or vomiting.  He has tried mucinex without much improvement.     ROS:  A comprehensive ROS was completed and negative except as noted per HPI  Past Medical History:  Diagnosis Date  . AICD (automatic cardioverter/defibrillator) present 2018   Biotronik  . Chronic systolic heart failure ([place]) 10/24/2018  . Coronary artery disease    2017 - 1 stent. 5 in October 2018  . DVT (deep venous thrombosis) (HCC)    Right leg  . Dyspnea    WITH EXERTION  . GERD (gastroesophageal reflux disease)   . Hypertension   . Myocardial infarction ([place])     X 1 2017x 2  in 02/2017 v. fib defib  . Peripheral vascular disease ([place]) 20 YRS AGO   DVT in right leg    Past Surgical History:  Procedure Laterality Date  . BIOPSY  05/07/2019   Procedure: BIOPSY;  Surgeon: Lavena Bullion, DO;  [facility];  Service: Gastroenterology;;  . CARDIAC CATHETERIZATION    . CHOLECYSTECTOMY    . COLONOSCOPY WITH PROPOFOL N/A 05/07/2019   Procedure: COLONOSCOPY WITH PROPOFOL;  Surgeon: Lavena Bullion, DO;  [facility];  Service: Gastroenterology;  Laterality: N/A;  . CORONARY ANGIOPLASTY    . ICD IMPLANT  2018   BIOTRONIK  . KNEE ARTHROSCOPY Right    x 2  . NASAL SEPTUM SURGERY      x 3  . POLYPECTOMY  05/07/2019   Procedure: POLYPECTOMY;  Surgeon: Lavena Bullion, DO;  [facility];  Service: Gastroenterology;;  . SHOULDER ARTHROSCOPY Left   . SHOULDER ARTHROSCOPY WITH ROTATOR CUFF REPAIR Right 11/05/2018   Procedure: SHOULDER ARTHROSCOPY   foreign body removal;  Surgeon: Netta Cedars, MD;  [facility];  Service: Orthopedics;  Laterality: Right;  . SHOULDER ARTHROSCOPY WITH ROTATOR CUFF REPAIR AND SUBACROMIAL DECOMPRESSION Right 02/23/2018   Procedure: RIGHT SHOULDER ARTHROSCOPY WITH OPEN ROTATOR CUFF REPAIR, SUBACROMIAL DECOMPRESSION, OPEN DISTAL CLAVICLE RESECTION, OPEN BICEP TENODESIS, LABRAL DEBRIDEMENT;  Surgeon: Netta Cedars, MD;  Location: [place];  Service: Orthopedics;  Laterality: Right;  . STENTS TO HEART X 6  1 X  2017 5 IN 2018  . WRIST SURGERY Right     Family History  Problem Relation Age of Onset  . Heart attack Father   . Colon cancer Neg Hx     Social History   Socioeconomic History  . Marital status: Significant Other    Spouse name: Not on file  . Number of children: Not on file  . Years of education: Not on file  . Highest education level: Not on file  Occupational History  . Not on file  Tobacco Use  . Smoking status: Former Smoker    Types: Cigarettes    Quit date: 2019    Years since  quitting: 2.4  . Smokeless tobacco: Current User    Types: Snuff  . Tobacco comment: quit 2-3 years ago  Vaping Use  . Vaping Use: Former  Substance and Sexual Activity  . Alcohol use: Yes    Alcohol/week: 3.0 standard drinks    Types: 3 Cans of beer per week    Comment: white liq  . Drug use: Not Currently  . Sexual activity: Yes    Partners: Female  Other Topics Concern  . Not on file  Social History Narrative  . Not on file   Social Determinants of Health   Financial Resource Strain:   . Difficulty of Paying Living Expenses:   Food Insecurity:   . Worried About Charity fundraiser in the Last Year:   . Arboriculturist in the Last Year:   Transportation Needs:   . Film/video editor (Medical):   Marland Kitchen Lack of Transportation (Non-Medical):   Physical Activity:   . Days of Exercise per Week:   . Minutes of Exercise per Session:   Stress:   . Feeling of Stress :   Social Connections:   . Frequency of Communication with Friends and Family:   . Frequency of Social Gatherings with Friends and Family:   . Attends Religious Services:   . Active Member of Clubs or Organizations:   . Attends Archivist Meetings:   Marland Kitchen Marital Status:   Intimate Partner Violence:   . Fear of Current or Ex-Partner:   . Emotionally Abused:   Marland Kitchen Physically Abused:   . Sexually Abused:      Current Outpatient Medications:  .  aspirin EC 81 MG tablet, Take 81 mg by mouth daily., Disp: , Rfl:  .  atorvastatin (LIPITOR) 80 MG tablet, Take 1 tablet (80 mg total) by mouth daily. Needs to establish with new PCP prior to additional refills, Disp: 30 tablet, Rfl: 0 .  azithromycin (ZITHROMAX Z-PAK) 250 MG tablet, Take 2 tablets (500 mg) on  Day 1,  followed by 1 tablet (250 mg) once daily on Days 2 through 5., Disp: 6 tablet, Rfl: 0 .  carvedilol (COREG) 6.25 MG tablet, Take 1 tablet (6.25 mg total) by mouth 2 (two) times daily. For heart and blood pressure, Disp: 180 tablet, Rfl: 1 .   chlorpheniramine-HYDROcodone (TUSSIONEX PENNKINETIC ER) 10-8 MG/5ML SUER, Take 5 mLs by mouth at bedtime as needed for cough., Disp: 75 mL, Rfl: 0 .  clopidogrel (PLAVIX) 75 MG tablet, TAKE 1 TABLET DAILY FOR STENTS, Disp: 30 tablet, Rfl: 0 .  diclofenac sodium (VOLTAREN) 1 % GEL, Apply 4 g topically 4 (four) times daily. To affected joint. (Patient taking differently: Apply 4 g topically 3 (three) times daily as needed (pain). To affected joint.), Disp: 100 g, Rfl: 11 .  furosemide (LASIX) 20 MG  tablet, TAKE 1 TABLET BY MOUTH DAILY FOR FLUID PILLS, Disp: 90 tablet, Rfl: 1 .  gabapentin (NEURONTIN) 100 MG capsule, gabapentin 100 mg capsule  TAKE 1 CAPSULE BY MOUTH THREE TIMES DAILY, Disp: , Rfl:  .  GOODYS EXTRA STRENGTH 520-260-32.5 MG PACK, Take 1 packet by mouth 3 (three) times daily as needed (for headaches/toothaches.)., Disp: , Rfl:  .  lisinopril (ZESTRIL) 5 MG tablet, Take 5 mg by mouth daily., Disp: , Rfl:  .  loperamide (IMODIUM) 2 MG capsule, loperamide 2 mg capsule  TAKE 1 CAPSULE BY MOUTH EVERY 4 TO 6 HOURS. MAX 8 CAPSULE DAILY, Disp: , Rfl:  .  meloxicam (MOBIC) 15 MG tablet, One tab PO qAM with a meal for 2 weeks, then daily prn pain., Disp: 30 tablet, Rfl: 3 .  nitroGLYCERIN (NITROSTAT) 0.4 MG SL tablet, Place 0.4 mg under the tongue every 5 (five) minutes as needed for chest pain. , Disp: , Rfl:  .  pantoprazole (PROTONIX) 40 MG tablet, TAKE 1 TABLET(40 MG) BY MOUTH DAILY FOR ACID REFLUX, Disp: 30 tablet, Rfl: 0 .  predniSONE (STERAPRED UNI-PAK 21 TAB) 10 MG (21) TBPK tablet, Taper as directed on packaging, Disp: 21 tablet, Rfl: 0 .  spironolactone (ALDACTONE) 25 MG tablet, Take 1 tablet (25 mg total) by mouth daily. Needs new PCP for further refills, Disp: 30 tablet, Rfl: 0 .  traMADol (ULTRAM) 50 MG tablet, Take 1 tablet (50 mg total) by mouth every 8 (eight) hours as needed for moderate pain. Maximum 6 tabs per day., Disp: 21 tablet, Rfl: 0 .  triamcinolone (NASACORT) 55 MCG/ACT  AERO nasal inhaler, Place 2 sprays into the nose daily., Disp: 1 Inhaler, Rfl: 11  EXAM:  VITALS per patient if applicable: BP 073/71   Temp (!) 96.1 F (35.6 C)   Ht 5\' 10"  (1.778 m)   Wt 204 lb 9.6 oz (92.8 kg)   BMI 29.36 kg/m   GENERAL: alert, oriented, sounds congested and in no acute distress  HEENT: atraumatic, conjunttiva clear, no obvious abnormalities on inspection of external nose and ears  NECK: normal movements of the head and neck  LUNGS: on inspection no signs of respiratory distress, breathing rate appears normal, no obvious gross SOB, gasping or wheezing  CV: no obvious cyanosis  MS: moves all visible extremities without noticeable abnormality  PSYCH/NEURO: pleasant and cooperative, no obvious depression or anxiety, speech and thought processing grossly intact  ASSESSMENT AND PLAN:  Discussed the following assessment and plan:  URI (upper respiratory infection) Recommend continued supportive care with good fluid intake and rest.  Will add tussionex at night for cough as this is keeping him from sleeping.  Adding prednisone taper for sore throat and laryngitis.  Instructed to call if he develops new or worsening symptoms.   30 minutes spent including pre visit preparation, review of prior notes and labs, encounter with patient via video visit and same day documentation.    I discussed the assessment and treatment plan with the patient. The patient was provided an opportunity to ask questions and all were answered. The patient agreed with the plan and demonstrated an understanding of the instructions.   The patient was advised to call back or seek an in-person evaluation if the symptoms worsen or if the condition fails to improve as anticipated.    Luetta Nutting, DO

## 2019-11-14 ENCOUNTER — Other Ambulatory Visit: Payer: Self-pay

## 2019-11-14 MED ORDER — FUROSEMIDE 20 MG PO TABS: ORAL_TABLET | ORAL | 0 refills | Status: AC

## 2019-12-13 ENCOUNTER — Other Ambulatory Visit: Payer: Self-pay | Admitting: Family Medicine

## 2019-12-23 ENCOUNTER — Other Ambulatory Visit: Payer: Self-pay | Admitting: Sports Medicine

## 2019-12-23 DIAGNOSIS — N644 Mastodynia: Secondary | ICD-10-CM

## 2019-12-25 ENCOUNTER — Emergency Department (HOSPITAL_BASED_OUTPATIENT_CLINIC_OR_DEPARTMENT_OTHER)
Admission: EM | Admit: 2019-12-25 | Discharge: 2019-12-25 | Disposition: A | Discharge status: Home / Self Care | Payer: 59 | Attending: Emergency Medicine | Admitting: Emergency Medicine

## 2019-12-25 ENCOUNTER — Emergency Department (HOSPITAL_BASED_OUTPATIENT_CLINIC_OR_DEPARTMENT_OTHER): Payer: 59

## 2019-12-25 ENCOUNTER — Encounter (HOSPITAL_BASED_OUTPATIENT_CLINIC_OR_DEPARTMENT_OTHER): Payer: Self-pay

## 2019-12-25 ENCOUNTER — Other Ambulatory Visit: Payer: Self-pay

## 2019-12-25 DIAGNOSIS — Z79899 Other long term (current) drug therapy: Secondary | ICD-10-CM | POA: Insufficient documentation

## 2019-12-25 DIAGNOSIS — Z85038 Personal history of other malignant neoplasm of large intestine: Secondary | ICD-10-CM | POA: Insufficient documentation

## 2019-12-25 DIAGNOSIS — Z20822 Contact with and (suspected) exposure to covid-19: Secondary | ICD-10-CM | POA: Insufficient documentation

## 2019-12-25 DIAGNOSIS — I252 Old myocardial infarction: Secondary | ICD-10-CM | POA: Insufficient documentation

## 2019-12-25 DIAGNOSIS — Z7982 Long term (current) use of aspirin: Secondary | ICD-10-CM | POA: Diagnosis not present

## 2019-12-25 DIAGNOSIS — R55 Syncope and collapse: Secondary | ICD-10-CM

## 2019-12-25 DIAGNOSIS — Z955 Presence of coronary angioplasty implant and graft: Secondary | ICD-10-CM | POA: Diagnosis not present

## 2019-12-25 DIAGNOSIS — R059 Cough, unspecified: Secondary | ICD-10-CM

## 2019-12-25 DIAGNOSIS — I2510 Atherosclerotic heart disease of native coronary artery without angina pectoris: Secondary | ICD-10-CM | POA: Insufficient documentation

## 2019-12-25 DIAGNOSIS — R1110 Vomiting, unspecified: Secondary | ICD-10-CM | POA: Insufficient documentation

## 2019-12-25 DIAGNOSIS — J189 Pneumonia, unspecified organism: Secondary | ICD-10-CM | POA: Diagnosis not present

## 2019-12-25 DIAGNOSIS — I110 Hypertensive heart disease with heart failure: Secondary | ICD-10-CM | POA: Diagnosis not present

## 2019-12-25 DIAGNOSIS — R42 Dizziness and giddiness: Secondary | ICD-10-CM | POA: Diagnosis not present

## 2019-12-25 DIAGNOSIS — Z86718 Personal history of other venous thrombosis and embolism: Secondary | ICD-10-CM | POA: Diagnosis not present

## 2019-12-25 DIAGNOSIS — I5022 Chronic systolic (congestive) heart failure: Secondary | ICD-10-CM | POA: Insufficient documentation

## 2019-12-25 DIAGNOSIS — Z87891 Personal history of nicotine dependence: Secondary | ICD-10-CM | POA: Insufficient documentation

## 2019-12-25 LAB — COMPREHENSIVE METABOLIC PANEL
ALT: 23 U/L (ref 0–44)
AST: 21 U/L (ref 15–41)
Albumin: 4.3 g/dL (ref 3.5–5.0)
Alkaline Phosphatase: 89 U/L (ref 38–126)
Anion gap: 14 (ref 5–15)
BUN: 24 mg/dL — ABNORMAL HIGH (ref 6–20)
CO2: 23 mmol/L (ref 22–32)
Calcium: 9.2 mg/dL (ref 8.9–10.3)
Chloride: 95 mmol/L — ABNORMAL LOW (ref 98–111)
Creatinine, Ser: 1.85 mg/dL — ABNORMAL HIGH (ref 0.61–1.24)
GFR calc Af Amer: 48 mL/min — ABNORMAL LOW (ref >60)
GFR calc non Af Amer: 41 mL/min — ABNORMAL LOW (ref >60)
Glucose, Bld: 103 mg/dL — ABNORMAL HIGH (ref 70–99)
Potassium: 4.1 mmol/L (ref 3.5–5.1)
Sodium: 132 mmol/L — ABNORMAL LOW (ref 135–145)
Total Bilirubin: 1.4 mg/dL — ABNORMAL HIGH (ref 0.3–1.2)
Total Protein: 7.7 g/dL (ref 6.5–8.1)

## 2019-12-25 LAB — CBC WITH DIFFERENTIAL/PLATELET
Abs Immature Granulocytes: 0.16 10*3/uL — ABNORMAL HIGH (ref 0.00–0.07)
Basophils Absolute: 0.1 10*3/uL (ref 0.0–0.1)
Basophils Relative: 1 %
Eosinophils Absolute: 0.1 10*3/uL (ref 0.0–0.5)
Eosinophils Relative: 0 %
HCT: 43.9 % (ref 39.0–52.0)
Hemoglobin: 15.2 g/dL (ref 13.0–17.0)
Immature Granulocytes: 1 %
Lymphocytes Relative: 14 %
Lymphs Abs: 1.9 10*3/uL (ref 0.7–4.0)
MCH: 32.7 pg (ref 26.0–34.0)
MCHC: 34.6 g/dL (ref 30.0–36.0)
MCV: 94.4 fL (ref 80.0–100.0)
Monocytes Absolute: 0.8 10*3/uL (ref 0.1–1.0)
Monocytes Relative: 6 %
Neutro Abs: 10.2 10*3/uL — ABNORMAL HIGH (ref 1.7–7.7)
Neutrophils Relative %: 78 %
Platelets: 233 10*3/uL (ref 150–400)
RBC: 4.65 MIL/uL (ref 4.22–5.81)
RDW: 12.1 % (ref 11.5–15.5)
WBC: 13.1 10*3/uL — ABNORMAL HIGH (ref 4.0–10.5)
nRBC: 0 % (ref 0.0–0.2)

## 2019-12-25 LAB — D-DIMER, QUANTITATIVE: D-Dimer, Quant: 0.79 ug/mL-FEU — ABNORMAL HIGH (ref 0.00–0.50)

## 2019-12-25 LAB — SARS CORONAVIRUS 2 BY RT PCR (HOSPITAL ORDER, PERFORMED IN ~~LOC~~ HOSPITAL LAB): SARS Coronavirus 2: NEGATIVE

## 2019-12-25 LAB — CBG MONITORING, ED: Glucose-Capillary: 96 mg/dL (ref 70–99)

## 2019-12-25 LAB — CK: Total CK: 191 U/L (ref 49–397)

## 2019-12-25 LAB — TROPONIN I (HIGH SENSITIVITY)
Troponin I (High Sensitivity): 8 ng/L (ref <18)
Troponin I (High Sensitivity): 7 ng/L (ref <18)

## 2019-12-25 MED ORDER — DOXYCYCLINE HYCLATE 100 MG PO CAPS: 100.0000 mg | ORAL_CAPSULE | Freq: Two times a day (BID) | ORAL | 0 refills | Status: AC

## 2019-12-25 MED ORDER — SODIUM CHLORIDE 0.9 % IV BOLUS: 500.0000 mL | Freq: Once | INTRAVENOUS | Status: DC

## 2019-12-25 MED ORDER — ASPIRIN 81 MG PO CHEW
324.0000 mg | CHEWABLE_TABLET | Freq: Once | ORAL | Status: AC
  Administered 2019-12-25: 324 mg via ORAL
  Filled 2019-12-25: qty 4

## 2019-12-25 MED ORDER — IOHEXOL 350 MG/ML SOLN
100.0000 mL | Freq: Once | INTRAVENOUS | Status: AC | PRN
  Administered 2019-12-25: 80 mL via INTRAVENOUS

## 2019-12-25 MED ORDER — SODIUM CHLORIDE 0.9 % IV BOLUS
500.0000 mL | Freq: Once | INTRAVENOUS | Status: AC
  Administered 2019-12-25: 500 mL via INTRAVENOUS

## 2019-12-25 MED ORDER — ALUM & MAG HYDROXIDE-SIMETH 200-200-20 MG/5ML PO SUSP
30.0000 mL | Freq: Once | ORAL | Status: AC
  Administered 2019-12-25: 30 mL via ORAL
  Filled 2019-12-25: qty 30

## 2019-12-25 MED ORDER — SODIUM CHLORIDE 0.9 % IV BOLUS
250.0000 mL | Freq: Once | INTRAVENOUS | Status: AC
  Administered 2019-12-25: 250 mL via INTRAVENOUS

## 2019-12-25 NOTE — ED Provider Notes (Signed)
[facility] EMERGENCY DEPARTMENT Provider Note   CSN: [id number] Arrival date & time: 12/25/19  1405     History Chief Complaint  Patient presents with   Chest Pain    Shawn Burke is a 49 y.o. male.  HPI   49 y/o male with a h/o AICD, CAD, CHD, MI, GERD, HTN, MI, PVD, who presents to the ED today for eval of a near syncopal episode.  Patient states he was working outside and loading things into his car when he suddenly felt diaphoretic, lightheaded, nauseated and felt like his vision was changing.  He also had an episode of vomiting.  He got into his car and turned the air conditioner on and symptoms improved after cooling down.  Following this he developed some burning to his chest.  He also experienced cramping to his bilateral hands and upper back.  He further reports that he has chronic shortness of breath but it has been a little bit worse over the last week or so and he has had a cough.  He has had no increase in bilateral lower extremity swelling.  States he just had a cath 3 weeks ago with his cardiologist.  Did not report that he had any stents placed at the time.  Pts fiance is at bedside and states that she took his BP at [place]em and it was low in the 22s  Past Medical History:  Diagnosis Date   AICD (automatic cardioverter/defibrillator) present 2018   Biotronik   Chronic systolic heart failure ([place]) 10/24/2018   Coronary artery disease    2017 - 1 stent. 5 in October 2018   DVT (deep venous thrombosis) (HCC)    Right leg   Dyspnea    WITH EXERTION   GERD (gastroesophageal reflux disease)    Hypertension    Myocardial infarction ([place])     X 1 2017x 2 in 02/2017 v. fib defib   Peripheral vascular disease ([place]) 20 YRS AGO   DVT in right leg    Patient Active Problem List   Diagnosis Date Noted   URI (upper respiratory infection) 11/13/2019   Acute rhinosinusitis 09/20/2019   Deep venous thrombosis ([place]) 06/14/2019   Colon cancer  screening    Diverticulosis of colon without hemorrhage    Internal hemorrhoids    Polyp of ascending colon    Rectal polyp    Breast pain, right 05/03/2019   Primary osteoarthritis of left knee 05/03/2019   Nipple pain 03/08/2019   Hyperlipidemia 50/27/7412   Chronic systolic heart failure ([place]) 10/24/2018   AICD (automatic cardioverter/defibrillator) present 03/27/2017   Ischemic cardiomyopathy 03/27/2017   Drug-induced erectile dysfunction 07/28/2016   History of MI (myocardial infarction) 07/28/2016   Coronary artery disease involving native coronary artery of native heart 04/29/2016   Primary osteoarthritis of right knee 12/16/2015   Essential hypertension 03/10/2014   Gastroesophageal reflux disease without esophagitis 03/10/2014   Pulmonary emphysema ([place]) 08/17/2011    Past Surgical History:  Procedure Laterality Date   BIOPSY  05/07/2019   Procedure: BIOPSY;  Surgeon: Lavena Bullion, DO;  [facility];  Service: Gastroenterology;;   CARDIAC CATHETERIZATION     CHOLECYSTECTOMY     COLONOSCOPY WITH PROPOFOL N/A 05/07/2019   Procedure: COLONOSCOPY WITH PROPOFOL;  Surgeon: Lavena Bullion, DO;  [facility];  Service: Gastroenterology;  Laterality: N/A;   CORONARY ANGIOPLASTY     ICD IMPLANT  2018   BIOTRONIK   KNEE ARTHROSCOPY Right  x 2   NASAL SEPTUM SURGERY      x 3   POLYPECTOMY  05/07/2019   Procedure: POLYPECTOMY;  Surgeon: Lavena Bullion, DO;  [facility];  Service: Gastroenterology;;   SHOULDER ARTHROSCOPY Left    SHOULDER ARTHROSCOPY WITH ROTATOR CUFF REPAIR Right 11/05/2018   Procedure: SHOULDER ARTHROSCOPY   foreign body removal;  Surgeon: Netta Cedars, MD;  [facility];  Service: Orthopedics;  Laterality: Right;   SHOULDER ARTHROSCOPY WITH ROTATOR CUFF REPAIR AND SUBACROMIAL DECOMPRESSION Right 02/23/2018   Procedure: RIGHT SHOULDER ARTHROSCOPY WITH OPEN ROTATOR CUFF REPAIR,  SUBACROMIAL DECOMPRESSION, OPEN DISTAL CLAVICLE RESECTION, OPEN BICEP TENODESIS, LABRAL DEBRIDEMENT;  Surgeon: Netta Cedars, MD;  Location: [facility];  Service: Orthopedics;  Laterality: Right;   STENTS TO HEART X 6  1 X 2017 5 IN 2018   WRIST SURGERY Right        Family History  Problem Relation Age of Onset   Heart attack Father    Colon cancer Neg Hx     Social History   Tobacco Use   Smoking status: Former Smoker    Types: Cigarettes    Quit date: 2019    Years since quitting: 2.5   Smokeless tobacco: Current User    Types: Snuff   Tobacco comment: quit 2-3 years ago  Vaping Use   Vaping Use: Former  Substance Use Topics   Alcohol use: Yes    Alcohol/week: 3.0 standard drinks    Types: 3 Cans of beer per week    Comment: white liq   Drug use: Not Currently    Home Medications Prior to Admission medications   Medication Sig Start Date End Date Taking? Authorizing Provider  aspirin EC 81 MG tablet Take 81 mg by mouth daily.   Yes [provider]  atorvastatin (LIPITOR) 80 MG tablet Take 1 tablet (80 mg total) by mouth daily. Needs to establish with new PCP prior to additional refills 09/27/19  Yes Silverio Decamp, MD  carvedilol (COREG) 6.25 MG tablet Take 1 tablet (6.25 mg total) by mouth 2 (two) times daily. For heart and blood pressure 03/27/19  Yes Gregor Hams, MD  clopidogrel (PLAVIX) 75 MG tablet TAKE 1 TABLET DAILY FOR STENTS Patient taking differently: Take 75 mg by mouth daily. For Stents 11/13/19  Yes Luetta Nutting, DO  furosemide (LASIX) 20 MG tablet TAKE 1 TABLET BY MOUTH DAILY FOR FLUID PILLS Patient taking differently: Take 20 mg by mouth daily.  11/14/19  Yes Luetta Nutting, DO  lisinopril (ZESTRIL) 5 MG tablet Take 5 mg by mouth daily. 11/11/19  Yes [provider]  nitroGLYCERIN (NITROSTAT) 0.4 MG SL tablet Place 0.4 mg under the tongue every 5 (five) minutes as needed for chest pain.   Yes [provider]    pantoprazole (PROTONIX) 40 MG tablet TAKE 1 TABLET(40 MG) BY MOUTH DAILY FOR ACID REFLUX Patient taking differently: Take 40 mg by mouth daily.  12/13/19  Yes Silverio Decamp, MD  spironolactone (ALDACTONE) 25 MG tablet Take 1 tablet (25 mg total) by mouth daily. Needs new PCP for further refills Patient taking differently: Take 25 mg by mouth daily.  10/10/19  Yes Silverio Decamp, MD  doxycycline (VIBRAMYCIN) 100 MG capsule Take 1 capsule (100 mg total) by mouth 2 (two) times daily for 7 days. 12/25/19 01/01/20  Jeweliana Dudgeon S, PA-C    Allergies    Coconut flavor  Review of Systems   Review of Systems  Constitutional:  Negative for fever.  HENT: Negative for ear pain and sore throat.   Eyes: Negative for visual disturbance.  Respiratory: Positive for cough and shortness of breath.   Cardiovascular: Positive for chest pain.  Gastrointestinal: Positive for vomiting. Negative for abdominal pain, constipation, diarrhea and nausea.  Genitourinary: Negative for dysuria and hematuria.  Musculoskeletal: Negative for back pain.  Skin: Negative for rash.  Neurological: Positive for light-headedness.       Near syncope  All other systems reviewed and are negative.   Physical Exam Updated Vital Signs BP 103/85 (BP Location: Right Arm)    Pulse 90    Temp 98.3 F (36.8 C) (Oral)    Resp 19    Ht 5\' 10"  (1.778 m)    Wt (!) 94.4 kg    SpO2 93%    BMI 29.87 kg/m   Physical Exam Vitals and nursing note reviewed.  Constitutional:      Appearance: He is well-developed.  HENT:     Head: Normocephalic and atraumatic.  Eyes:     Conjunctiva/sclera: Conjunctivae normal.  Cardiovascular:     Rate and Rhythm: Normal rate and regular rhythm.     Heart sounds: Normal heart sounds. No murmur heard.   Pulmonary:     Effort: Pulmonary effort is normal. No respiratory distress.     Breath sounds: Wheezing present.  Abdominal:     Palpations: Abdomen is soft.     Tenderness: There is  no abdominal tenderness. There is no guarding or rebound.  Musculoskeletal:     Cervical back: Neck supple.     Right lower leg: No tenderness. No edema.     Left lower leg: No tenderness. No edema.  Skin:    General: Skin is warm and dry.  Neurological:     Mental Status: He is alert.     ED Results / Procedures / Treatments   Labs (all labs ordered are listed, but only abnormal results are displayed) Labs Reviewed  CBC WITH DIFFERENTIAL/PLATELET - Abnormal; Notable for the following components:      Result Value   WBC 13.1 (*)    Neutro Abs 10.2 (*)    Abs Immature Granulocytes 0.16 (*)    All other components within normal limits  COMPREHENSIVE METABOLIC PANEL - Abnormal; Notable for the following components:   Sodium 132 (*)    Chloride 95 (*)    Glucose, Bld 103 (*)    BUN 24 (*)    Creatinine, Ser 1.85 (*)    Total Bilirubin 1.4 (*)    GFR calc non Af Amer 41 (*)    GFR calc Af Amer 48 (*)    All other components within normal limits  D-DIMER, QUANTITATIVE (NOT AT [facility]) - Abnormal; Notable for the following components:   D-Dimer, Quant 0.79 (*)    All other components within normal limits  SARS CORONAVIRUS 2 BY RT PCR (HOSPITAL ORDER, [facility])  CK  CBG MONITORING, ED  TROPONIN I (HIGH SENSITIVITY)  TROPONIN I (HIGH SENSITIVITY)    EKG EKG Interpretation  Date/Time:  Wednesday December 25 2019 14:22:22 EDT Ventricular Rate:  84 PR Interval:    QRS Duration: 90 QT Interval:  358 QTC Calculation: 424 R Axis:   -31 Text Interpretation: Sinus rhythm Inferior infarct, old Abnormal lateral Q waves Anterior infarct, old No significant change since last tracing Confirmed by Lennice Sites [phone]) on 12/25/2019 2:27:20 PM   Radiology CT Angio Chest PE W and/or  Wo Contrast  Result Date: 12/25/2019 CLINICAL DATA:  Diaphoretic dizzy EXAM: CT ANGIOGRAPHY CHEST WITH CONTRAST TECHNIQUE: Multidetector CT imaging of the chest was performed using  the standard protocol during bolus administration of intravenous contrast. Multiplanar CT image reconstructions and MIPs were obtained to evaluate the vascular anatomy. CONTRAST:  84mL OMNIPAQUE IOHEXOL 350 MG/ML SOLN COMPARISON:  Chest x-ray 12/25/2019 FINDINGS: Cardiovascular: Satisfactory opacification of the pulmonary arteries to the segmental level. No evidence of pulmonary embolism. Normal heart size. No pericardial effusion. Ectatic ascending aorta without aneurysm. No dissection is seen. Coronary vascular calcification. Mild aortic atherosclerosis. Partially visualized intracardiac pacing leads. Mediastinum/Nodes: No enlarged mediastinal, hilar, or axillary lymph nodes. Thyroid gland, trachea, and esophagus demonstrate no significant findings. Lungs/Pleura: Moderate severe emphysema. No pneumothorax or pleural effusion. Subpleural ground-glass density in the bilateral lower lobes. Upper Abdomen: No acute abnormality. Musculoskeletal: No chest wall abnormality. No acute or significant osseous findings. Review of the MIP images confirms the above findings. IMPRESSION: 1. Negative for acute pulmonary embolus or aortic dissection. 2. Emphysema. Mild subpleural posterior ground-glass density in the lower lobes probably represents dependent atelectasis, though could consider atypical/viral pneumonia Aortic Atherosclerosis (ICD10-I70.0) and Emphysema (ICD10-J43.9). Electronically Signed   By: Donavan Foil M.D.   On: 12/25/2019 17:49   DG Chest Port 1 View  Result Date: 12/25/2019 CLINICAL DATA:  Cough EXAM: PORTABLE CHEST 1 VIEW COMPARISON:  None. FINDINGS: Left-sided pacing device with incompletely visualized pacing lead. Relative hyperlucency in the upper lobes, possible emphysematous disease. Increased hazy bibasilar opacity. Cardiomediastinal silhouette within normal limits. No pneumothorax. IMPRESSION: 1. Low lung volumes. Increased bibasilar hazy lung opacity which may reflect atelectasis or mild  pneumonia 2. Relative hyperlucency at the upper lobes suspect for emphysematous disease Electronically Signed   By: Donavan Foil M.D.   On: 12/25/2019 15:33    Procedures Procedures (including critical care time)  Medications Ordered in ED Medications  sodium chloride 0.9 % bolus 250 mL ( Intravenous Stopped 12/25/19 1617)  alum & mag hydroxide-simeth (MAALOX/MYLANTA) 200-200-20 MG/5ML suspension 30 mL (30 mLs Oral Given 12/25/19 1458)  aspirin chewable tablet 324 mg (324 mg Oral Given 12/25/19 1458)  iohexol (OMNIPAQUE) 350 MG/ML injection 100 mL (80 mLs Intravenous Contrast Given 12/25/19 1720)  sodium chloride 0.9 % bolus 500 mL (500 mLs Intravenous New Bag/Given 12/25/19 1826)    ED Course  I have reviewed the triage vital signs and the nursing notes.  Pertinent labs & imaging results that were available during my care of the patient were reviewed by me and considered in my medical decision making (see chart for details).    MDM Rules/Calculators/A&P                          49 year old male presenting near syncopal episode.  This was associated with an episode of vomiting and subsequent burning chest pain.  Has also had a cough and some increased shortness of breath for the last week.  Was hypotensive at home prior to arrival.  CBC showed a mild leukocytosis at 13,000, no anemia CMP showed a mildly elevated creatinine 1.85 with an elevated BUN at 24.  Suspect this is secondary to dehydration.  LFTs normal. CK is within normal limits Delta troponins are negative   - suspect ACS much less likely given atypical symptoms and resolution of symptoms after GI cocktail D-dimer was marginally elevated, CTA ordered  Chest x-ray reviewed/interpreted -  1. Low lung volumes. Increased bibasilar hazy lung opacity  which may reflect atelectasis or mild pneumonia 2. Relative hyperlucency at the upper lobes suspect for emphysematous disease  CTA chest - 1. Negative for acute pulmonary embolus or  aortic dissection. 2. Emphysema. Mild subpleural posterior ground-glass density in the lower lobes probably represents dependent atelectasis, though could consider atypical/viral pneumonia  Patient symptoms today likely related to vasovagal episode due to dehydration.  Suspect that his muscle cramping is due to working in the heat and being dehydrated as well.  I have low suspicion for ACS.  CTA negative for PE but did show evidence of atypical/viral pneumonia.  Covid testing is negative.  I will treat him for possible atypical pneumonia with doxycycline.  Have advised close follow-up with PCP and strict return precautions.  He voiced understanding of plan and reasons to return.  All Questions answered.  Patient stable for discharge.  Final Clinical Impression(s) / ED Diagnoses Final diagnoses:  Vasovagal episode  Atypical pneumonia    Rx / DC Orders ED Discharge Orders         Ordered    doxycycline (VIBRAMYCIN) 100 MG capsule  2 times daily     Discontinue  Reprint     12/25/19 1850           Romond Pipkins, Willia Craze, PA-C 12/25/19 1851    Drenda Freeze, MD 12/25/19 [phone]

## 2019-12-25 NOTE — Discharge Instructions (Signed)
You were given a prescription for antibiotics. Please take the antibiotic prescription fully.   Please follow up with your primary care provider within 5-7 days for re-evaluation of your symptoms. If you do not have a primary care provider, information for a healthcare clinic has been provided for you to make arrangements for follow up care. Please return to the emergency department for any new or worsening symptoms.  

## 2019-12-25 NOTE — ED Notes (Signed)
ED Provider at bedside. 

## 2019-12-25 NOTE — ED Triage Notes (Signed)
Pt arrives to ED reports becoming diaphoretic and dizzy today when working out in the heat. Pt reports vomiting during this time. Pt has [employer] Defibrillator. C/O burning in central chest at this time. He states that during this episode both of his hands contracted, also c/o lower back pain.

## 2019-12-25 NOTE — ED Notes (Signed)
Pt is lying down for 42mins for orthostatics RN Vivien Rota and EMT Edison Nasuti informed

## 2019-12-25 NOTE — ED Notes (Signed)
Pt on monitor 

## 2020-01-12 ENCOUNTER — Other Ambulatory Visit: Payer: Self-pay | Admitting: Sports Medicine

## 2020-01-13 NOTE — Telephone Encounter (Signed)
To PCP

## 2020-02-11 ENCOUNTER — Other Ambulatory Visit: Payer: Self-pay | Admitting: Family Medicine

## 2020-02-18 ENCOUNTER — Encounter: Payer: Self-pay | Admitting: Family Medicine

## 2020-02-18 ENCOUNTER — Ambulatory Visit (INDEPENDENT_AMBULATORY_CARE_PROVIDER_SITE_OTHER): Payer: 59 | Admitting: Family Medicine

## 2020-02-18 DIAGNOSIS — R221 Localized swelling, mass and lump, neck: Secondary | ICD-10-CM | POA: Insufficient documentation

## 2020-02-18 MED ORDER — AMOXICILLIN-POT CLAVULANATE 875-125 MG PO TABS: 1.0000 | ORAL_TABLET | Freq: Two times a day (BID) | ORAL | 0 refills | Status: DC

## 2020-02-18 MED ORDER — AMOXICILLIN-POT CLAVULANATE 875-125 MG PO TABS
1.0000 | ORAL_TABLET | Freq: Two times a day (BID) | ORAL | 0 refills | Status: DC
Start: 2020-02-18 — End: 2020-02-18

## 2020-02-18 NOTE — Patient Instructions (Signed)
Start antibiotic as directed.  Let me know if not improving

## 2020-02-18 NOTE — Assessment & Plan Note (Signed)
Area appears to be enlarged submandibular node.  Area is somewhat tender.  Will see if this improves with augmentin.  Appears less likely to be an abscess at this time.  Discussed obtaining US if no improvement with augmentin.

## 2020-02-18 NOTE — Progress Notes (Signed)
Shawn Burke - 49 y.o. male MRN [id number]  Date of birth: [birth year]  Subjective Chief Complaint  Patient presents with  . Mass    HPI Shawn Burke is a 49 y.o. male here today with complaint of swollen area on neck. Located on L side of neck.  He report that area appeared about 4-5 days ago and has grown in size since first appearing.  The area is tender.  It has affected his swallowing.  He denies fever, chills, drainage from area, tooth pain, sore throat, headache, ear pain, or nausea.  He has not tried anything so far for this.  He does use oral tobacco products.   ROS:  A comprehensive ROS was completed and negative except as noted per HPI  Allergies  Allergen Reactions  . Coconut Flavor Anaphylaxis, Shortness Of Breath and Swelling    Past Medical History:  Diagnosis Date  . AICD (automatic cardioverter/defibrillator) present 2018   Biotronik  . Chronic systolic heart failure ([place]) 10/24/2018  . Coronary artery disease    2017 - 1 stent. 5 in October 2018  . DVT (deep venous thrombosis) ([facility])    Right leg  . Dyspnea    WITH EXERTION  . GERD (gastroesophageal reflux disease)   . Hypertension   . Myocardial infarction ([place])     X 1 2017x 2 in 02/2017 v. fib defib  . Peripheral vascular disease ([place]) 20 YRS AGO   DVT in right leg    Past Surgical History:  Procedure Laterality Date  . BIOPSY  05/07/2019   Procedure: BIOPSY;  Surgeon: Lavena Bullion, DO;  Location: [facility];  Service: Gastroenterology;;  . CARDIAC CATHETERIZATION    . CHOLECYSTECTOMY    . COLONOSCOPY WITH PROPOFOL N/A 05/07/2019   Procedure: COLONOSCOPY WITH PROPOFOL;  Surgeon: Lavena Bullion, DO;  [facility];  Service: Gastroenterology;  Laterality: N/A;  . CORONARY ANGIOPLASTY    . ICD IMPLANT  2018   BIOTRONIK  . KNEE ARTHROSCOPY Right    x 2  . NASAL SEPTUM SURGERY      x 3  . POLYPECTOMY  05/07/2019   Procedure: POLYPECTOMY;  Surgeon: Lavena Bullion, DO;   [facility];  Service: Gastroenterology;;  . SHOULDER ARTHROSCOPY Left   . SHOULDER ARTHROSCOPY WITH ROTATOR CUFF REPAIR Right 11/05/2018   Procedure: SHOULDER ARTHROSCOPY   foreign body removal;  Surgeon: Netta Cedars, MD;  Location: [facility];  Service: Orthopedics;  Laterality: Right;  . SHOULDER ARTHROSCOPY WITH ROTATOR CUFF REPAIR AND SUBACROMIAL DECOMPRESSION Right 02/23/2018   Procedure: RIGHT SHOULDER ARTHROSCOPY WITH OPEN ROTATOR CUFF REPAIR, SUBACROMIAL DECOMPRESSION, OPEN DISTAL CLAVICLE RESECTION, OPEN BICEP TENODESIS, LABRAL DEBRIDEMENT;  Surgeon: Netta Cedars, MD;  Location: [place];  Service: Orthopedics;  Laterality: Right;  . STENTS TO HEART X 6  1 X 2017 5 IN 2018  . WRIST SURGERY Right     Social History   Socioeconomic History  . Marital status: Significant Other    Spouse name: Not on file  . Number of children: Not on file  . Years of education: Not on file  . Highest education level: Not on file  Occupational History  . Not on file  Tobacco Use  . Smoking status: Former Smoker    Types: Cigarettes    Quit date: 2019    Years since quitting: 2.7  . Smokeless tobacco: Current User    Types: Snuff  . Tobacco comment: quit 2-3 years ago  Vaping Use  .  Vaping Use: Former  Substance and Sexual Activity  . Alcohol use: Yes    Alcohol/week: 3.0 standard drinks    Types: 3 Cans of beer per week    Comment: white liq  . Drug use: Not Currently  . Sexual activity: Yes    Partners: Female  Other Topics Concern  . Not on file  Social History Narrative  . Not on file   Social Determinants of Health   Financial Resource Strain:   . Difficulty of Paying Living Expenses: Not on file  Food Insecurity:   . Worried About Charity fundraiser in the Last Year: Not on file  . Ran Out of Food in the Last Year: Not on file  Transportation Needs:   . Lack of Transportation (Medical): Not on file  . Lack of Transportation (Non-Medical): Not on file  Physical  Activity:   . Days of Exercise per Week: Not on file  . Minutes of Exercise per Session: Not on file  Stress:   . Feeling of Stress : Not on file  Social Connections:   . Frequency of Communication with Friends and Family: Not on file  . Frequency of Social Gatherings with Friends and Family: Not on file  . Attends Religious Services: Not on file  . Active Member of Clubs or Organizations: Not on file  . Attends Archivist Meetings: Not on file  . Marital Status: Not on file    Family History  Problem Relation Age of Onset  . Heart attack Father   . Colon cancer Neg Hx     Health Maintenance  Topic Date Due  . Hepatitis C Screening  Never done  . HIV Screening  Never done  . INFLUENZA VACCINE  12/29/2019  . TETANUS/TDAP  09/04/2022  . COLONOSCOPY  05/06/2029  . COVID-19 Vaccine  Completed     ----------------------------------------------------------------------------------------------------------------------------------------------------------------------------------------------------------------- Physical Exam BP 118/70 (BP Location: Left Arm, Patient Position: Sitting, Cuff Size: Normal)   Pulse 70   Temp 98 F (36.7 C) (Oral)   Wt 206 lb 11.2 oz (93.8 kg)   SpO2 98%   BMI 29.66 kg/m   Physical Exam HENT:     Head: Normocephalic and atraumatic.     Mouth/Throat:     Comments: No obvious oral/tongue lesion noted.  Eyes:     General: No scleral icterus. Neck:     Comments: Firm, mildly tender mass in L submandibular area.  There is no fluctuance or surrounding induration.  Cardiovascular:     Rate and Rhythm: Normal rate and regular rhythm.  Pulmonary:     Effort: Pulmonary effort is normal.     Breath sounds: Normal breath sounds.  Musculoskeletal:     Cervical back: Neck supple.  Neurological:     General: No focal deficit present.  Psychiatric:        Mood and Affect: Mood normal.        Behavior: Behavior normal.      ------------------------------------------------------------------------------------------------------------------------------------------------------------------------------------------------------------------- Assessment and Plan  Neck mass Area appears to be enlarged submandibular node.  Area is somewhat tender.  Will see if this improves with augmentin.  Appears less likely to be an abscess at this time.  Discussed obtaining US if no improvement with augmentin.     Meds ordered this encounter  Medications  . DISCONTD: amoxicillin-clavulanate (AUGMENTIN) 875-125 MG tablet    Sig: Take 1 tablet by mouth 2 (two) times daily.    Dispense:  20 tablet    Refill:  0  . amoxicillin-clavulanate (AUGMENTIN) 875-125 MG tablet    Sig: Take 1 tablet by mouth 2 (two) times daily.    Dispense:  20 tablet    Refill:  0    No follow-ups on file.    This visit occurred during the SARS-CoV-2 public health emergency.  Safety protocols were in place, including screening questions prior to the visit, additional usage of staff PPE, and extensive cleaning of exam room while observing appropriate contact time as indicated for disinfecting solutions.

## 2020-03-02 ENCOUNTER — Encounter: Payer: Self-pay | Admitting: Family Medicine

## 2020-03-02 ENCOUNTER — Ambulatory Visit (INDEPENDENT_AMBULATORY_CARE_PROVIDER_SITE_OTHER): Payer: 59 | Admitting: Family Medicine

## 2020-03-02 ENCOUNTER — Other Ambulatory Visit: Payer: Self-pay

## 2020-03-02 VITALS — BP 118/63 | HR 62 | Wt 205.0 lb

## 2020-03-02 DIAGNOSIS — Z23 Encounter for immunization: Secondary | ICD-10-CM | POA: Diagnosis not present

## 2020-03-02 DIAGNOSIS — R221 Localized swelling, mass and lump, neck: Secondary | ICD-10-CM

## 2020-03-02 NOTE — Patient Instructions (Signed)
Stop downstairs and schedule ultrasound.  Do not try to remove this area yourself.  You will likely create more problems than you will solve.

## 2020-03-02 NOTE — Progress Notes (Signed)
Shawn Burke - 49 y.o. male MRN [id number]  Date of birth: 04-23-1971  Subjective Chief Complaint  Patient presents with  . Mass    HPI Shawn Burke is a 49 y.o. male here today for follow up of neck mass.  He completed course of augmentin without any change or improvement.  Area remains firm and tender.  He denies fever, chills, shortness of breath.  He does notice when swallowing but denies pain with swallowing.    ROS:  A comprehensive ROS was completed and negative except as noted per HPI  Allergies  Allergen Reactions  . Coconut Flavor Anaphylaxis, Shortness Of Breath and Swelling    Past Medical History:  Diagnosis Date  . AICD (automatic cardioverter/defibrillator) present 2018   Biotronik  . Chronic systolic heart failure ([place]) 10/24/2018  . Coronary artery disease    2017 - 1 stent. 5 in October 2018  . DVT (deep venous thrombosis) ([facility])    Right leg  . Dyspnea    WITH EXERTION  . GERD (gastroesophageal reflux disease)   . Hypertension   . Myocardial infarction ([place])     X 1 2017x 2 in 02/2017 v. fib defib  . Peripheral vascular disease ([place]) 20 YRS AGO   DVT in right leg    Past Surgical History:  Procedure Laterality Date  . BIOPSY  05/07/2019   Procedure: BIOPSY;  Surgeon: Lavena Bullion, DO;  [facility];  Service: Gastroenterology;;  . CARDIAC CATHETERIZATION    . CHOLECYSTECTOMY    . COLONOSCOPY WITH PROPOFOL N/A 05/07/2019   Procedure: COLONOSCOPY WITH PROPOFOL;  Surgeon: Lavena Bullion, DO;  [facility];  Service: Gastroenterology;  Laterality: N/A;  . CORONARY ANGIOPLASTY    . ICD IMPLANT  2018   BIOTRONIK  . KNEE ARTHROSCOPY Right    x 2  . NASAL SEPTUM SURGERY      x 3  . POLYPECTOMY  05/07/2019   Procedure: POLYPECTOMY;  Surgeon: Lavena Bullion, DO;  [facility];  Service: Gastroenterology;;  . SHOULDER ARTHROSCOPY Left   . SHOULDER ARTHROSCOPY WITH ROTATOR CUFF REPAIR Right 11/05/2018    Procedure: SHOULDER ARTHROSCOPY   foreign body removal;  Surgeon: Netta Cedars, MD;  [facility];  Service: Orthopedics;  Laterality: Right;  . SHOULDER ARTHROSCOPY WITH ROTATOR CUFF REPAIR AND SUBACROMIAL DECOMPRESSION Right 02/23/2018   Procedure: RIGHT SHOULDER ARTHROSCOPY WITH OPEN ROTATOR CUFF REPAIR, SUBACROMIAL DECOMPRESSION, OPEN DISTAL CLAVICLE RESECTION, OPEN BICEP TENODESIS, LABRAL DEBRIDEMENT;  Surgeon: Netta Cedars, MD;  Location: [place];  Service: Orthopedics;  Laterality: Right;  . STENTS TO HEART X 6  1 X 2017 5 IN 2018  . WRIST SURGERY Right     Social History   Socioeconomic History  . Marital status: Significant Other    Spouse name: Not on file  . Number of children: Not on file  . Years of education: Not on file  . Highest education level: Not on file  Occupational History  . Not on file  Tobacco Use  . Smoking status: Former Smoker    Types: Cigarettes    Quit date: 2019    Years since quitting: 2.7  . Smokeless tobacco: Current User    Types: Snuff  . Tobacco comment: quit 2-3 years ago  Vaping Use  . Vaping Use: Former  Substance and Sexual Activity  . Alcohol use: Yes    Alcohol/week: 3.0 standard drinks    Types: 3 Cans of beer per week  Comment: white liq  . Drug use: Not Currently  . Sexual activity: Yes    Partners: Female  Other Topics Concern  . Not on file  Social History Narrative  . Not on file   Social Determinants of Health   Financial Resource Strain:   . Difficulty of Paying Living Expenses: Not on file  Food Insecurity:   . Worried About Charity fundraiser in the Last Year: Not on file  . Ran Out of Food in the Last Year: Not on file  Transportation Needs:   . Lack of Transportation (Medical): Not on file  . Lack of Transportation (Non-Medical): Not on file  Physical Activity:   . Days of Exercise per Week: Not on file  . Minutes of Exercise per Session: Not on file  Stress:   . Feeling of Stress : Not on file   Social Connections:   . Frequency of Communication with Friends and Family: Not on file  . Frequency of Social Gatherings with Friends and Family: Not on file  . Attends Religious Services: Not on file  . Active Member of Clubs or Organizations: Not on file  . Attends Archivist Meetings: Not on file  . Marital Status: Not on file    Family History  Problem Relation Age of Onset  . Heart attack Father   . Colon cancer Neg Hx     Health Maintenance  Topic Date Due  . Hepatitis C Screening  Never done  . HIV Screening  Never done  . TETANUS/TDAP  09/04/2022  . COLONOSCOPY  05/06/2029  . INFLUENZA VACCINE  Completed  . COVID-19 Vaccine  Completed     ----------------------------------------------------------------------------------------------------------------------------------------------------------------------------------------------------------------- Physical Exam BP 118/63 (BP Location: Left Arm, Patient Position: Sitting, Cuff Size: Normal)   Pulse 62   Wt 205 lb (93 kg)   SpO2 97%   BMI 29.41 kg/m   Physical Exam Constitutional:      Appearance: Normal appearance.  Eyes:     General: No scleral icterus. Neck:     Comments: Firm submandibular nodule, mildly tender.  No fluctuance.  Cardiovascular:     Rate and Rhythm: Normal rate and regular rhythm.  Pulmonary:     Effort: Pulmonary effort is normal.     Breath sounds: Normal breath sounds.  Neurological:     General: No focal deficit present.     Mental Status: He is alert.  Psychiatric:        Mood and Affect: Mood normal.        Behavior: Behavior normal.     ------------------------------------------------------------------------------------------------------------------------------------------------------------------------------------------------------------------- Assessment and Plan  Neck mass No improvement with augmentin.   Will obtain US to better define what area is. DDx  includes lymph node vs cyst.  I think abscess is less likely as there does not appear to be any fluctuance or induration. Marland Kitchen   He states that he plans to remove this himself if not improving soon.  I advised that he should definitely not do this.       No orders of the defined types were placed in this encounter.   No follow-ups on file.    This visit occurred during the SARS-CoV-2 public health emergency.  Safety protocols were in place, including screening questions prior to the visit, additional usage of staff PPE, and extensive cleaning of exam room while observing appropriate contact time as indicated for disinfecting solutions.

## 2020-03-02 NOTE — Assessment & Plan Note (Addendum)
No improvement with augmentin.   Will obtain US to better define what area is. DDx includes lymph node vs cyst.  I think abscess is less likely as there does not appear to be any fluctuance or induration. Marland Kitchen   He states that he plans to remove this himself if not improving soon.  I advised that he should definitely not do this.

## 2020-03-04 ENCOUNTER — Ambulatory Visit (INDEPENDENT_AMBULATORY_CARE_PROVIDER_SITE_OTHER): Payer: 59

## 2020-03-04 ENCOUNTER — Other Ambulatory Visit: Payer: Self-pay

## 2020-03-04 DIAGNOSIS — R221 Localized swelling, mass and lump, neck: Secondary | ICD-10-CM | POA: Diagnosis not present

## 2020-03-05 ENCOUNTER — Other Ambulatory Visit: Payer: Self-pay | Admitting: Family Medicine

## 2020-03-05 MED ORDER — DOXYCYCLINE HYCLATE 100 MG PO TABS: 100.0000 mg | ORAL_TABLET | Freq: Two times a day (BID) | ORAL | 0 refills | Status: DC

## 2020-03-31 ENCOUNTER — Other Ambulatory Visit: Payer: Self-pay | Admitting: Family Medicine

## 2020-03-31 NOTE — Telephone Encounter (Signed)
We received refill request for Carvedilol.  It appears that you have taken over care for this pt for primary care purposes from Dr. Georgina Snell.  Please refill if appropriate.  Thank you.

## 2020-04-20 ENCOUNTER — Other Ambulatory Visit: Payer: Self-pay | Admitting: Sports Medicine

## 2020-06-10 ENCOUNTER — Other Ambulatory Visit: Payer: Self-pay | Admitting: Family Medicine

## 2020-06-30 ENCOUNTER — Other Ambulatory Visit: Payer: Self-pay

## 2020-06-30 ENCOUNTER — Ambulatory Visit (INDEPENDENT_AMBULATORY_CARE_PROVIDER_SITE_OTHER): Payer: 59 | Admitting: Family Medicine

## 2020-06-30 ENCOUNTER — Encounter: Payer: Self-pay | Admitting: Family Medicine

## 2020-06-30 ENCOUNTER — Ambulatory Visit (INDEPENDENT_AMBULATORY_CARE_PROVIDER_SITE_OTHER): Payer: 59

## 2020-06-30 VITALS — BP 114/75 | HR 81 | Wt 214.0 lb

## 2020-06-30 DIAGNOSIS — M25461 Effusion, right knee: Secondary | ICD-10-CM | POA: Diagnosis not present

## 2020-06-30 DIAGNOSIS — M25561 Pain in right knee: Secondary | ICD-10-CM

## 2020-06-30 DIAGNOSIS — R2241 Localized swelling, mass and lump, right lower limb: Secondary | ICD-10-CM | POA: Diagnosis not present

## 2020-06-30 NOTE — Assessment & Plan Note (Signed)
Xrays ordered of the R knee.  NO fractures noted.  Fitted for patellar stabilizing knee brace.  Recommend continued icing and he may utilize tylenol as needed.  He will follow up if symptoms not improving over the next couple of weeks.

## 2020-06-30 NOTE — Progress Notes (Signed)
Shawn Burke - 50 y.o. male MRN [id number]  Date of birth: [birth year]  Subjective Chief Complaint  Patient presents with  . Knee Injury    HPI Shawn Burke is a 50 y.o. male here today with complaint of R knee pain.  He was climbing into the back of his truck 3 days ago and hit his knee along bed of his truck.  Noticed immediate swelling and bruising.  He is having pain with weight bearing and has intermittent popping sensation when straightening the knee.  He has had previous arthroscopy to this knee. He has tried ice.  He has not tried any analgesics.    ROS:  A comprehensive ROS was completed and negative except as noted per HPI  Allergies  Allergen Reactions  . Coconut Flavor Anaphylaxis, Shortness Of Breath and Swelling    Past Medical History:  Diagnosis Date  . AICD (automatic cardioverter/defibrillator) present 2018   Biotronik  . Chronic systolic heart failure ([place]) 10/24/2018  . Coronary artery disease    2017 - 1 stent. 5 in October 2018  . DVT (deep venous thrombosis) ([facility])    Right leg  . Dyspnea    WITH EXERTION  . GERD (gastroesophageal reflux disease)   . Hypertension   . Myocardial infarction ([place])     X 1 2017x 2 in 02/2017 v. fib defib  . Peripheral vascular disease ([place]) 20 YRS AGO   DVT in right leg    Past Surgical History:  Procedure Laterality Date  . BIOPSY  05/07/2019   Procedure: BIOPSY;  Surgeon: Lavena Bullion, DO;  [facility];  Service: Gastroenterology;;  . CARDIAC CATHETERIZATION    . CHOLECYSTECTOMY    . COLONOSCOPY WITH PROPOFOL N/A 05/07/2019   Procedure: COLONOSCOPY WITH PROPOFOL;  Surgeon: Lavena Bullion, DO;  [facility];  Service: Gastroenterology;  Laterality: N/A;  . CORONARY ANGIOPLASTY    . ICD IMPLANT  2018   BIOTRONIK  . KNEE ARTHROSCOPY Right    x 2  . NASAL SEPTUM SURGERY      x 3  . POLYPECTOMY  05/07/2019   Procedure: POLYPECTOMY;  Surgeon: Lavena Bullion, DO;  [facility];  Service: Gastroenterology;;  . SHOULDER ARTHROSCOPY Left   . SHOULDER ARTHROSCOPY WITH ROTATOR CUFF REPAIR Right 11/05/2018   Procedure: SHOULDER ARTHROSCOPY   foreign body removal;  Surgeon: Netta Cedars, MD;  [facility];  Service: Orthopedics;  Laterality: Right;  . SHOULDER ARTHROSCOPY WITH ROTATOR CUFF REPAIR AND SUBACROMIAL DECOMPRESSION Right 02/23/2018   Procedure: RIGHT SHOULDER ARTHROSCOPY WITH OPEN ROTATOR CUFF REPAIR, SUBACROMIAL DECOMPRESSION, OPEN DISTAL CLAVICLE RESECTION, OPEN BICEP TENODESIS, LABRAL DEBRIDEMENT;  Surgeon: Netta Cedars, MD;  Location: [place];  Service: Orthopedics;  Laterality: Right;  . STENTS TO HEART X 6  1 X 2017 5 IN 2018  . WRIST SURGERY Right     Social History   Socioeconomic History  . Marital status: Significant Other    Spouse name: Not on file  . Number of children: Not on file  . Years of education: Not on file  . Highest education level: Not on file  Occupational History  . Not on file  Tobacco Use  . Smoking status: Former Smoker    Types: Cigarettes    Quit date: 2019    Years since quitting: 3.0  . Smokeless tobacco: Current User    Types: Snuff  . Tobacco comment: quit 2-3 years ago  Vaping Use  . Vaping  Use: Former  Substance and Sexual Activity  . Alcohol use: Yes    Alcohol/week: 3.0 standard drinks    Types: 3 Cans of beer per week    Comment: white liq  . Drug use: Not Currently  . Sexual activity: Yes    Partners: Female  Other Topics Concern  . Not on file  Social History Narrative  . Not on file   Social Determinants of Health   Financial Resource Strain: Not on file  Food Insecurity: Not on file  Transportation Needs: Not on file  Physical Activity: Not on file  Stress: Not on file  Social Connections: Not on file    Family History  Problem Relation Age of Onset  . Heart attack Father   . Colon cancer Neg Hx     Health Maintenance  Topic Date Due  . Hepatitis C Screening  Never done   . HIV Screening  Never done  . COVID-19 Vaccine (3 - Booster for Moderna series) 04/03/2020  . TETANUS/TDAP  09/04/2022  . COLONOSCOPY (Pts 45-1yrs Insurance coverage will need to be confirmed)  05/06/2029  . INFLUENZA VACCINE  Completed     ----------------------------------------------------------------------------------------------------------------------------------------------------------------------------------------------------------------- Physical Exam BP 114/75 (BP Location: Left Arm, Patient Position: Sitting, Cuff Size: Large)   Pulse 81   Wt 214 lb (97.1 kg)   SpO2 98%   BMI 30.71 kg/m   Physical Exam Constitutional:      Appearance: Normal appearance.  Musculoskeletal:     Comments: R knee with ecchymoses around the patella.  TTP along medial and lateral joint line. Mild pain with manipulation of patella.  No ligamentous laxity noted. Negative Lachman test.  Mild pain with loading of the medial meniscus on [place] with popping sensation over patella.    Neurological:     Mental Status: He is alert.  Psychiatric:        Mood and Affect: Mood normal.        Behavior: Behavior normal.    xrays reviewed and independently interpreted by me: No significant effusion noted.  No fracture noted.   ------------------------------------------------------------------------------------------------------------------------------------------------------------------------------------------------------------------- Assessment and Plan  Pain and swelling of right knee Xrays ordered of the R knee.  NO fractures noted.  Fitted for patellar stabilizing knee brace.  Recommend continued icing and he may utilize tylenol as needed.  He will follow up if symptoms not improving over the next couple of weeks.    No orders of the defined types were placed in this encounter.   No follow-ups on file.    This visit occurred during the SARS-CoV-2 public health emergency.  Safety  protocols were in place, including screening questions prior to the visit, additional usage of staff PPE, and extensive cleaning of exam room while observing appropriate contact time as indicated for disinfecting solutions.

## 2020-06-30 NOTE — Patient Instructions (Addendum)
Have xrays completed, we'll be in touch with results Use brace for now.  We may need to get you in with Dr. Darene Lamer to take a look at the knee as well if not improving.  You may use tylenol if needed for pain.  Avoid ibuprofen or aleve.

## 2020-07-13 ENCOUNTER — Telehealth (INDEPENDENT_AMBULATORY_CARE_PROVIDER_SITE_OTHER): Payer: 59 | Admitting: Family Medicine

## 2020-07-13 ENCOUNTER — Encounter: Payer: Self-pay | Admitting: Family Medicine

## 2020-07-13 DIAGNOSIS — J209 Acute bronchitis, unspecified: Secondary | ICD-10-CM | POA: Diagnosis not present

## 2020-07-13 MED ORDER — DOXYCYCLINE HYCLATE 100 MG PO TABS: 100.0000 mg | ORAL_TABLET | Freq: Two times a day (BID) | ORAL | 0 refills | Status: DC

## 2020-07-13 MED ORDER — HYDROCOD POLST-CPM POLST ER 10-8 MG/5ML PO SUER: 5.0000 mL | Freq: Every evening | ORAL | 0 refills | Status: DC | PRN

## 2020-07-13 MED ORDER — PREDNISONE 20 MG PO TABS: 20.0000 mg | ORAL_TABLET | Freq: Two times a day (BID) | ORAL | 0 refills | Status: AC

## 2020-07-13 NOTE — Progress Notes (Signed)
Shawn Burke - 50 y.o. male MRN [id number]  Date of birth: 1970/10/26   This visit type was conducted due to national recommendations for restrictions regarding the COVID-19 Pandemic (e.g. social distancing).  This format is felt to be most appropriate for this patient at this time.  All issues noted in this document were discussed and addressed.  No physical exam was performed (except for noted visual exam findings with Video Visits).  I discussed the limitations of evaluation and management by telemedicine and the availability of in person appointments. The patient expressed understanding and agreed to proceed.  I connected with@ on 07/13/20 at 10:10 AM EST by a video enabled telemedicine application and verified that I am speaking with the correct person using two identifiers.  Present at visit: Luetta Nutting, DO [place]   Patient Location: [place] [id number]   Provider location:   [facility]  Chief Complaint  Patient presents with  . Cough    HPI  Shawn Burke is a 50 y.o. male who presents via audio/video conferencing for a telehealth visit today.  He has complaint of cough with wheezing.  Symptom started about 10 days ago.  He has history of mild COPD, quit smoking in 2019.  He denies fever, chills, chest pain.  He has had some relief with Vicks and mucinex.   He has has negative COVID test x3.     ROS:  A comprehensive ROS was completed and negative except as noted per HPI  Past Medical History:  Diagnosis Date  . AICD (automatic cardioverter/defibrillator) present 2018   Biotronik  . Chronic systolic heart failure ([place]) 10/24/2018  . Coronary artery disease    2017 - 1 stent. 5 in October 2018  . DVT (deep venous thrombosis) (HCC)    Right leg  . Dyspnea    WITH EXERTION  . GERD (gastroesophageal reflux disease)   . Hypertension   . Myocardial infarction ([place])     X 1 2017x 2 in 02/2017 v. fib defib  . Peripheral  vascular disease ([place]) 20 YRS AGO   DVT in right leg    Past Surgical History:  Procedure Laterality Date  . BIOPSY  05/07/2019   Procedure: BIOPSY;  Surgeon: Lavena Bullion, DO;  Location: [facility];  Service: Gastroenterology;;  . CARDIAC CATHETERIZATION    . CHOLECYSTECTOMY    . COLONOSCOPY WITH PROPOFOL N/A 05/07/2019   Procedure: COLONOSCOPY WITH PROPOFOL;  Surgeon: Lavena Bullion, DO;  Location: [facility];  Service: Gastroenterology;  Laterality: N/A;  . CORONARY ANGIOPLASTY    . ICD IMPLANT  2018   BIOTRONIK  . KNEE ARTHROSCOPY Right    x 2  . NASAL SEPTUM SURGERY      x 3  . POLYPECTOMY  05/07/2019   Procedure: POLYPECTOMY;  Surgeon: Lavena Bullion, DO;  Location: [facility];  Service: Gastroenterology;;  . SHOULDER ARTHROSCOPY Left   . SHOULDER ARTHROSCOPY WITH ROTATOR CUFF REPAIR Right 11/05/2018   Procedure: SHOULDER ARTHROSCOPY   foreign body removal;  Surgeon: Netta Cedars, MD;  Location: [facility];  Service: Orthopedics;  Laterality: Right;  . SHOULDER ARTHROSCOPY WITH ROTATOR CUFF REPAIR AND SUBACROMIAL DECOMPRESSION Right 02/23/2018   Procedure: RIGHT SHOULDER ARTHROSCOPY WITH OPEN ROTATOR CUFF REPAIR, SUBACROMIAL DECOMPRESSION, OPEN DISTAL CLAVICLE RESECTION, OPEN BICEP TENODESIS, LABRAL DEBRIDEMENT;  Surgeon: Netta Cedars, MD;  Location: [place];  Service: Orthopedics;  Laterality: Right;  . STENTS TO HEART X 6  1 X  2017 5 IN 2018  . WRIST SURGERY Right     Family History  Problem Relation Age of Onset  . Heart attack Father   . Colon cancer Neg Hx     Social History   Socioeconomic History  . Marital status: Significant Other    Spouse name: Not on file  . Number of children: Not on file  . Years of education: Not on file  . Highest education level: Not on file  Occupational History  . Not on file  Tobacco Use  . Smoking status: Former Smoker    Types: Cigarettes    Quit date: 2019    Years since quitting: 3.1  . Smokeless tobacco:  Current User    Types: Snuff  . Tobacco comment: quit 2-3 years ago  Vaping Use  . Vaping Use: Former  Substance and Sexual Activity  . Alcohol use: Yes    Alcohol/week: 3.0 standard drinks    Types: 3 Cans of beer per week    Comment: white liq  . Drug use: Not Currently  . Sexual activity: Yes    Partners: Female  Other Topics Concern  . Not on file  Social History Narrative  . Not on file   Social Determinants of Health   Financial Resource Strain: Not on file  Food Insecurity: Not on file  Transportation Needs: Not on file  Physical Activity: Not on file  Stress: Not on file  Social Connections: Not on file  Intimate Partner Violence: Not on file     Current Outpatient Medications:  .  aspirin EC 81 MG tablet, Take 81 mg by mouth daily., Disp: , Rfl:  .  atorvastatin (LIPITOR) 80 MG tablet, Take 1 tablet (80 mg total) by mouth daily. Needs to establish with new PCP prior to additional refills, Disp: 30 tablet, Rfl: 0 .  carvedilol (COREG) 6.25 MG tablet, TAKE 1 TABLET TWICE DAILY FOR HEART& BLOOD PRESSURE, Disp: 180 tablet, Rfl: 1 .  chlorpheniramine-HYDROcodone (TUSSIONEX PENNKINETIC ER) 10-8 MG/5ML SUER, Take 5 mLs by mouth at bedtime as needed for cough., Disp: 140 mL, Rfl: 0 .  clopidogrel (PLAVIX) 75 MG tablet, TAKE 1 TABLET DAILY FOR STENTS (Patient taking differently: Take 75 mg by mouth daily. For Stents), Disp: 30 tablet, Rfl: 0 .  doxycycline (VIBRA-TABS) 100 MG tablet, Take 1 tablet (100 mg total) by mouth 2 (two) times daily., Disp: 20 tablet, Rfl: 0 .  furosemide (LASIX) 20 MG tablet, TAKE 1 TABLET BY MOUTH DAILY FOR FLUID PILLS (Patient taking differently: Take 20 mg by mouth daily.), Disp: 30 tablet, Rfl: 0 .  lisinopril (ZESTRIL) 5 MG tablet, Take 5 mg by mouth daily., Disp: , Rfl:  .  nitroGLYCERIN (NITROSTAT) 0.4 MG SL tablet, Place 0.4 mg under the tongue every 5 (five) minutes as needed for chest pain., Disp: , Rfl:  .  pantoprazole (PROTONIX) 40 MG  tablet, TAKE 1 TABLET(40 MG) BY MOUTH DAILY FOR ACID REFLUX, Disp: 30 tablet, Rfl: 3 .  potassium chloride SA (KLOR-CON) 20 MEQ tablet, Take 20 mEq by mouth daily., Disp: , Rfl:  .  predniSONE (DELTASONE) 20 MG tablet, Take 1 tablet (20 mg total) by mouth 2 (two) times daily with a meal for 5 days., Disp: 10 tablet, Rfl: 0 .  spironolactone (ALDACTONE) 25 MG tablet, Take 1 tablet (25 mg total) by mouth daily., Disp: 90 tablet, Rfl: 0  EXAM:  VITALS per patient if applicable: Temp (!) 78.2 F (35.2 C)   Wt  210 lb (95.3 kg)   BMI 30.13 kg/m   GENERAL: alert, oriented, appears well and in no acute distress  HEENT: atraumatic, conjunttiva clear, no obvious abnormalities on inspection of external nose and ears  NECK: normal movements of the head and neck  LUNGS: on inspection no signs of respiratory distress, breathing rate appears normal, no obvious gross SOB, gasping or wheezing  CV: no obvious cyanosis  MS: moves all visible extremities without noticeable abnormality  PSYCH/NEURO: pleasant and cooperative, no obvious depression or anxiety, speech and thought processing grossly intact  ASSESSMENT AND PLAN:  Discussed the following assessment and plan:  Acute bronchitis Start doxycycline 100mg  BID Will also add prednisone 20mg  BID and tussionex as needed.  Increased fluids and symptomatic treatment as needed.   Instructed to contact clinic if symptoms continue to worsen     I discussed the assessment and treatment plan with the patient. The patient was provided an opportunity to ask questions and all were answered. The patient agreed with the plan and demonstrated an understanding of the instructions.   The patient was advised to call back or seek an in-person evaluation if the symptoms worsen or if the condition fails to improve as anticipated.    Luetta Nutting, DO

## 2020-07-13 NOTE — Assessment & Plan Note (Addendum)
Start doxycycline 100mg  BID Will also add prednisone 20mg  BID and tussionex as needed.  Increased fluids and symptomatic treatment as needed.   Instructed to contact clinic if symptoms continue to worsen

## 2020-07-13 NOTE — Progress Notes (Signed)
Cough x 1 week, sore throat  COVID tests x3 = negative  Meds: Vicks Cold & Flu, Mucinex

## 2020-07-15 ENCOUNTER — Other Ambulatory Visit: Payer: Self-pay | Admitting: Osteopathic Medicine

## 2020-07-17 ENCOUNTER — Other Ambulatory Visit: Payer: Self-pay

## 2020-07-17 DIAGNOSIS — I10 Essential (primary) hypertension: Secondary | ICD-10-CM

## 2020-07-17 MED ORDER — SPIRONOLACTONE 25 MG PO TABS: 25.0000 mg | ORAL_TABLET | Freq: Every day | ORAL | 0 refills | Status: AC

## 2020-09-18 ENCOUNTER — Other Ambulatory Visit: Payer: Self-pay | Admitting: Family Medicine

## 2020-09-23 ENCOUNTER — Other Ambulatory Visit: Payer: Self-pay

## 2020-09-23 ENCOUNTER — Telehealth (INDEPENDENT_AMBULATORY_CARE_PROVIDER_SITE_OTHER): Payer: 59 | Admitting: Family Medicine

## 2020-09-23 ENCOUNTER — Ambulatory Visit (INDEPENDENT_AMBULATORY_CARE_PROVIDER_SITE_OTHER): Payer: 59

## 2020-09-23 ENCOUNTER — Encounter: Payer: Self-pay | Admitting: Family Medicine

## 2020-09-23 VITALS — Ht 70.0 in | Wt 210.0 lb

## 2020-09-23 DIAGNOSIS — J441 Chronic obstructive pulmonary disease with (acute) exacerbation: Secondary | ICD-10-CM | POA: Diagnosis not present

## 2020-09-23 DIAGNOSIS — R059 Cough, unspecified: Secondary | ICD-10-CM | POA: Diagnosis not present

## 2020-09-23 DIAGNOSIS — R0600 Dyspnea, unspecified: Secondary | ICD-10-CM

## 2020-09-23 MED ORDER — ALBUTEROL SULFATE HFA 108 (90 BASE) MCG/ACT IN AERS: 2.0000 | INHALATION_SPRAY | Freq: Four times a day (QID) | RESPIRATORY_TRACT | 0 refills | Status: DC | PRN

## 2020-09-23 MED ORDER — AMOXICILLIN-POT CLAVULANATE 875-125 MG PO TABS: 1.0000 | ORAL_TABLET | Freq: Two times a day (BID) | ORAL | 0 refills | Status: AC

## 2020-09-23 MED ORDER — HYDROCOD POLST-CPM POLST ER 10-8 MG/5ML PO SUER: 5.0000 mL | Freq: Every evening | ORAL | 0 refills | Status: AC | PRN

## 2020-09-23 NOTE — Progress Notes (Signed)
Shawn Burke - 50 y.o. male MRN [id number]  Date of birth: July 05, 1970   This visit type was conducted due to national recommendations for restrictions regarding the COVID-19 Pandemic (e.g. social distancing).  This format is felt to be most appropriate for this patient at this time.  All issues noted in this document were discussed and addressed.  No physical exam was performed (except for noted visual exam findings with Video Visits).  I discussed the limitations of evaluation and management by telemedicine and the availability of in person appointments. The patient expressed understanding and agreed to proceed.  I connected with@ on 09/23/20 at  1:30 PM EDT by a video enabled telemedicine application and verified that I am speaking with the correct person using two identifiers.  Present at visit: Luetta Nutting, DO [place]   Patient Location: [place] [id number]   Provider location:   [facility]  Chief Complaint  Patient presents with  . Cough  . Chills    HPI  Shawn Burke is a 50 y.o. male who presents via audio/video conferencing for a telehealth visit today.  He has complaint of cough, congestion, wheezing, shortness of breath, sweats and chills.  Symptoms started about 5 days ago.  He has taken home COVID test x2 which was negative.  Denies SI symptoms including chest pain, shortness of breath, palpitations, headaches or body aches.    ROS:  A comprehensive ROS was completed and negative except as noted per HPI  Past Medical History:  Diagnosis Date  . AICD (automatic cardioverter/defibrillator) present 2018   Biotronik  . Chronic systolic heart failure ([place]) 10/24/2018  . Coronary artery disease    2017 - 1 stent. 5 in October 2018  . DVT (deep venous thrombosis) (HCC)    Right leg  . Dyspnea    WITH EXERTION  . GERD (gastroesophageal reflux disease)   . Hypertension   . Myocardial infarction ([place])     X 1 2017x 2 in  02/2017 v. fib defib  . Peripheral vascular disease ([place]) 20 YRS AGO   DVT in right leg    Past Surgical History:  Procedure Laterality Date  . BIOPSY  05/07/2019   Procedure: BIOPSY;  Surgeon: Lavena Bullion, DO;  [facility];  Service: Gastroenterology;;  . CARDIAC CATHETERIZATION    . CHOLECYSTECTOMY    . COLONOSCOPY WITH PROPOFOL N/A 05/07/2019   Procedure: COLONOSCOPY WITH PROPOFOL;  Surgeon: Lavena Bullion, DO;  [facility];  Service: Gastroenterology;  Laterality: N/A;  . CORONARY ANGIOPLASTY    . ICD IMPLANT  2018   BIOTRONIK  . KNEE ARTHROSCOPY Right    x 2  . NASAL SEPTUM SURGERY      x 3  . POLYPECTOMY  05/07/2019   Procedure: POLYPECTOMY;  Surgeon: Lavena Bullion, DO;  [facility];  Service: Gastroenterology;;  . SHOULDER ARTHROSCOPY Left   . SHOULDER ARTHROSCOPY WITH ROTATOR CUFF REPAIR Right 11/05/2018   Procedure: SHOULDER ARTHROSCOPY   foreign body removal;  Surgeon: Netta Cedars, MD;  [facility];  Service: Orthopedics;  Laterality: Right;  . SHOULDER ARTHROSCOPY WITH ROTATOR CUFF REPAIR AND SUBACROMIAL DECOMPRESSION Right 02/23/2018   Procedure: RIGHT SHOULDER ARTHROSCOPY WITH OPEN ROTATOR CUFF REPAIR, SUBACROMIAL DECOMPRESSION, OPEN DISTAL CLAVICLE RESECTION, OPEN BICEP TENODESIS, LABRAL DEBRIDEMENT;  Surgeon: Netta Cedars, MD;  Location: [place];  Service: Orthopedics;  Laterality: Right;  . STENTS TO HEART X 6  1 X 2017 5  IN 2018  . WRIST SURGERY Right     Family History  Problem Relation Age of Onset  . Heart attack Father   . Colon cancer Neg Hx     Social History   Socioeconomic History  . Marital status: Significant Other    Spouse name: Not on file  . Number of children: Not on file  . Years of education: Not on file  . Highest education level: Not on file  Occupational History  . Not on file  Tobacco Use  . Smoking status: Former Smoker    Types: Cigarettes    Quit date: 2019    Years since  quitting: 3.3  . Smokeless tobacco: Current User    Types: Snuff  . Tobacco comment: quit 2-3 years ago  Vaping Use  . Vaping Use: Former  Substance and Sexual Activity  . Alcohol use: Yes    Alcohol/week: 3.0 standard drinks    Types: 3 Cans of beer per week    Comment: white liq  . Drug use: Not Currently  . Sexual activity: Yes    Partners: Female  Other Topics Concern  . Not on file  Social History Narrative  . Not on file   Social Determinants of Health   Financial Resource Strain: Not on file  Food Insecurity: Not on file  Transportation Needs: Not on file  Physical Activity: Not on file  Stress: Not on file  Social Connections: Not on file  Intimate Partner Violence: Not on file     Current Outpatient Medications:  .  albuterol (VENTOLIN HFA) 108 (90 Base) MCG/ACT inhaler, Inhale 2 puffs into the lungs every 6 (six) hours as needed for wheezing or shortness of breath., Disp: 8 g, Rfl: 0 .  amoxicillin-clavulanate (AUGMENTIN) 875-125 MG tablet, Take 1 tablet by mouth 2 (two) times daily., Disp: 20 tablet, Rfl: 0 .  aspirin EC 81 MG tablet, Take 81 mg by mouth daily., Disp: , Rfl:  .  atorvastatin (LIPITOR) 80 MG tablet, Take 1 tablet (80 mg total) by mouth daily. Needs to establish with new PCP prior to additional refills, Disp: 30 tablet, Rfl: 0 .  carvedilol (COREG) 6.25 MG tablet, Take 1 tablet (6.25 mg total) by mouth 2 (two) times daily with a meal. Needs appt for further refills, Disp: 180 tablet, Rfl: 0 .  clopidogrel (PLAVIX) 75 MG tablet, TAKE 1 TABLET DAILY FOR STENTS (Patient taking differently: Take 75 mg by mouth daily. For Stents), Disp: 30 tablet, Rfl: 0 .  furosemide (LASIX) 20 MG tablet, TAKE 1 TABLET BY MOUTH DAILY FOR FLUID PILLS (Patient taking differently: Take 20 mg by mouth daily.), Disp: 30 tablet, Rfl: 0 .  lisinopril (ZESTRIL) 5 MG tablet, Take 5 mg by mouth daily., Disp: , Rfl:  .  nitroGLYCERIN (NITROSTAT) 0.4 MG SL tablet, Place 0.4 mg under  the tongue every 5 (five) minutes as needed for chest pain., Disp: , Rfl:  .  pantoprazole (PROTONIX) 40 MG tablet, TAKE 1 TABLET(40 MG) BY MOUTH DAILY FOR ACID REFLUX, Disp: 30 tablet, Rfl: 3 .  spironolactone (ALDACTONE) 25 MG tablet, Take 1 tablet (25 mg total) by mouth daily., Disp: 90 tablet, Rfl: 0 .  chlorpheniramine-HYDROcodone (TUSSIONEX PENNKINETIC ER) 10-8 MG/5ML SUER, Take 5 mLs by mouth at bedtime as needed for cough., Disp: 140 mL, Rfl: 0  EXAM:  VITALS per patient if applicable: Ht 5\' 10"  (1.778 m)   Wt 210 lb (95.3 kg)   BMI 30.13 kg/m  GENERAL: alert, oriented, appears well and in no acute distress  HEENT: atraumatic, conjunttiva clear, no obvious abnormalities on inspection of external nose and ears  NECK: normal movements of the head and neck  LUNGS: on inspection no signs of respiratory distress, breathing rate appears normal, no obvious gross SOB, gasping or wheezing  CV: no obvious cyanosis  MS: moves all visible extremities without noticeable abnormality  PSYCH/NEURO: pleasant and cooperative, no obvious depression or anxiety, speech and thought processing grossly intact  ASSESSMENT AND PLAN:  Discussed the following assessment and plan:  COPD exacerbation (HCC) Start augmentin x10 days.  He doesn't want to add prednisone at this time.  Albuterol and tussionex renewed.  CXR ordered.   If not improving will need to see in clinic.       I discussed the assessment and treatment plan with the patient. The patient was provided an opportunity to ask questions and all were answered. The patient agreed with the plan and demonstrated an understanding of the instructions.   The patient was advised to call back or seek an in-person evaluation if the symptoms worsen or if the condition fails to improve as anticipated.    Luetta Nutting, DO

## 2020-09-23 NOTE — Progress Notes (Signed)
Symptoms: sore throat, chest congestion, head congestion. Green milky mucus.   Meds: left over cough medicine.

## 2020-09-23 NOTE — Assessment & Plan Note (Signed)
Start augmentin x10 days.  He doesn't want to add prednisone at this time.  Albuterol and tussionex renewed.  CXR ordered.   If not improving will need to see in clinic.

## 2020-10-08 ENCOUNTER — Other Ambulatory Visit: Payer: Self-pay | Admitting: Family Medicine

## 2020-10-13 ENCOUNTER — Other Ambulatory Visit: Payer: Self-pay | Admitting: Family Medicine

## 2020-10-27 IMAGING — DX DG KNEE 1-2V*R*
4 series · 4 of 4 positions shown · non-contrast
Comparison: None.

CLINICAL DATA: Left medial knee pain, recent fall, history of
surgery to the right knee

EXAM:
RIGHT KNEE - 1-2 VIEW; LEFT KNEE - COMPLETE 4+ VIEW

[tunnel]
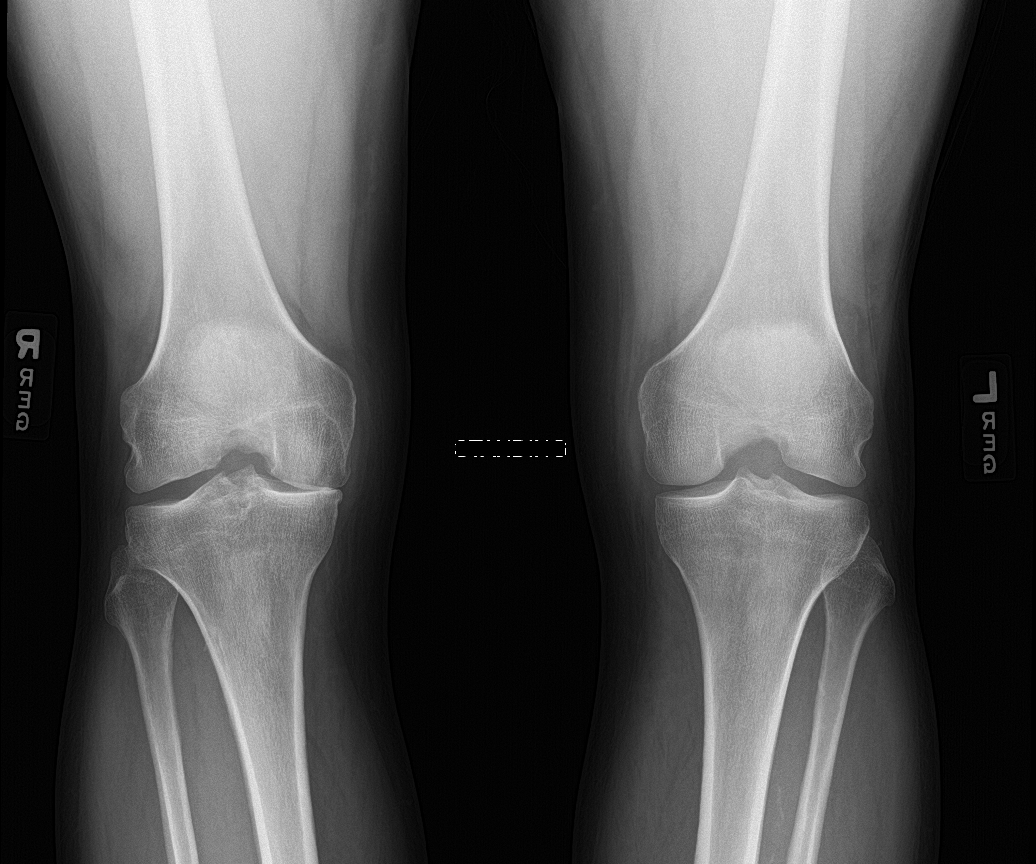

[knee lat]
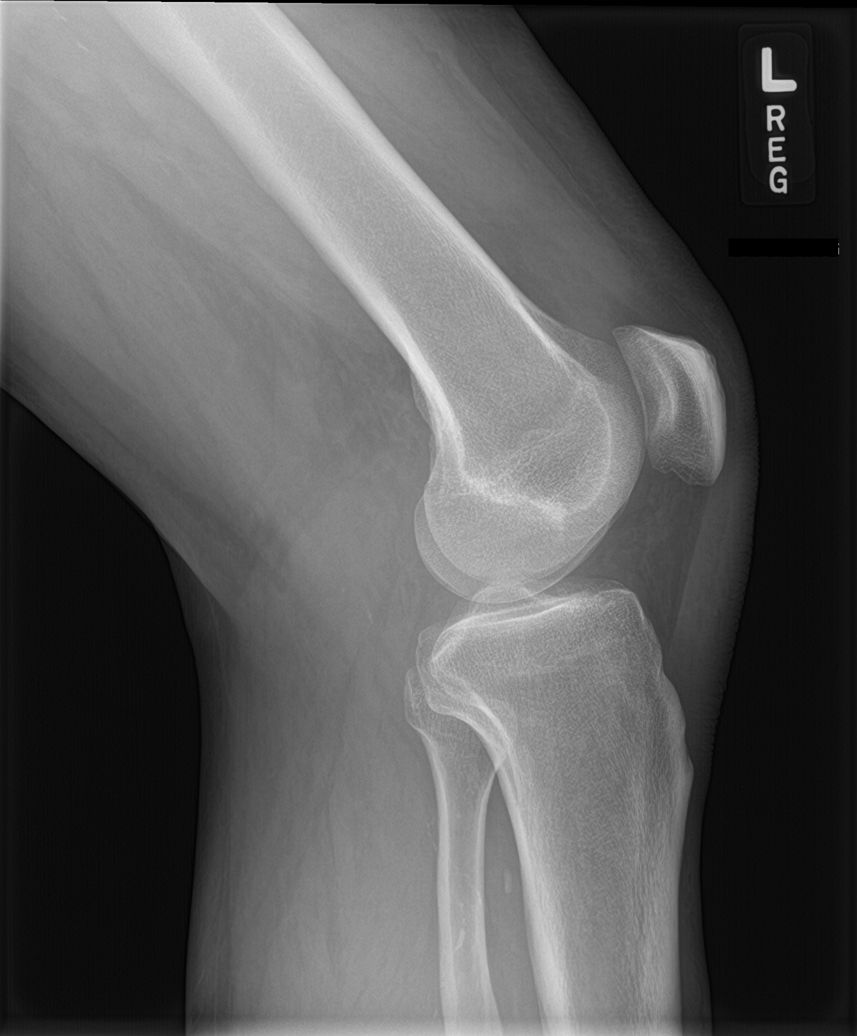

[knee sunrise]
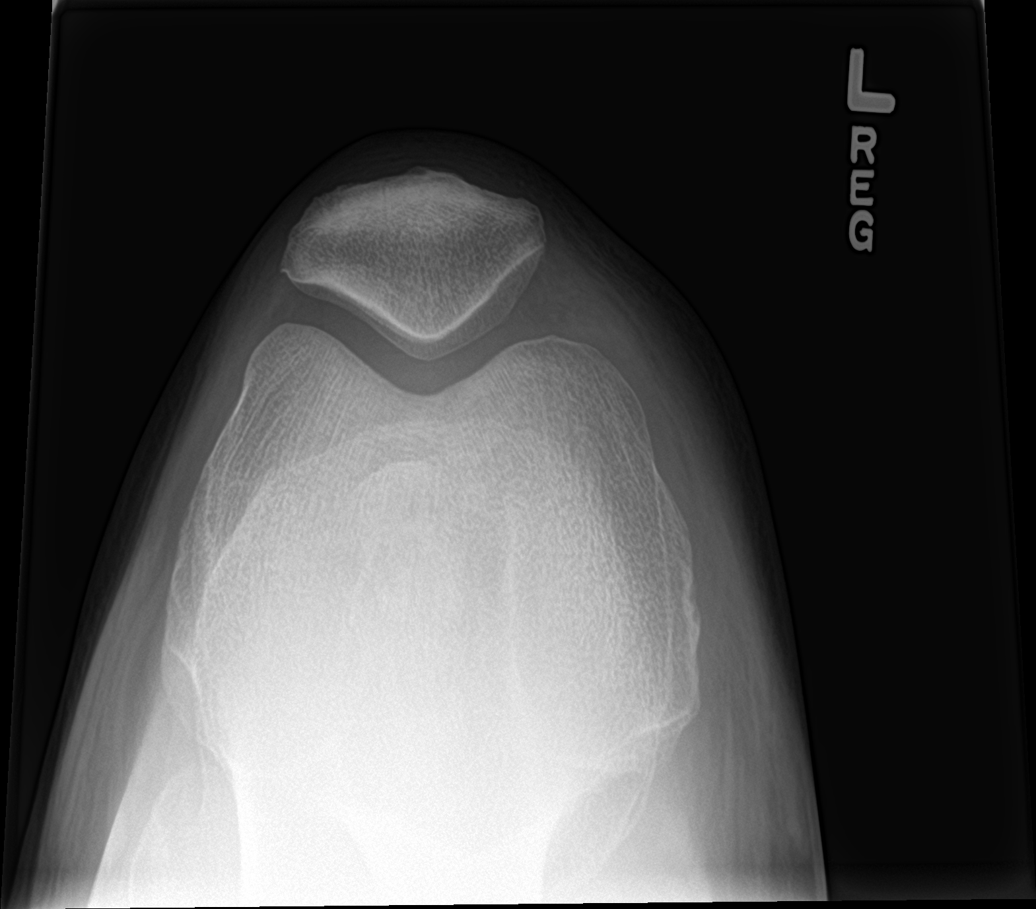

[knee ap bilat standing]
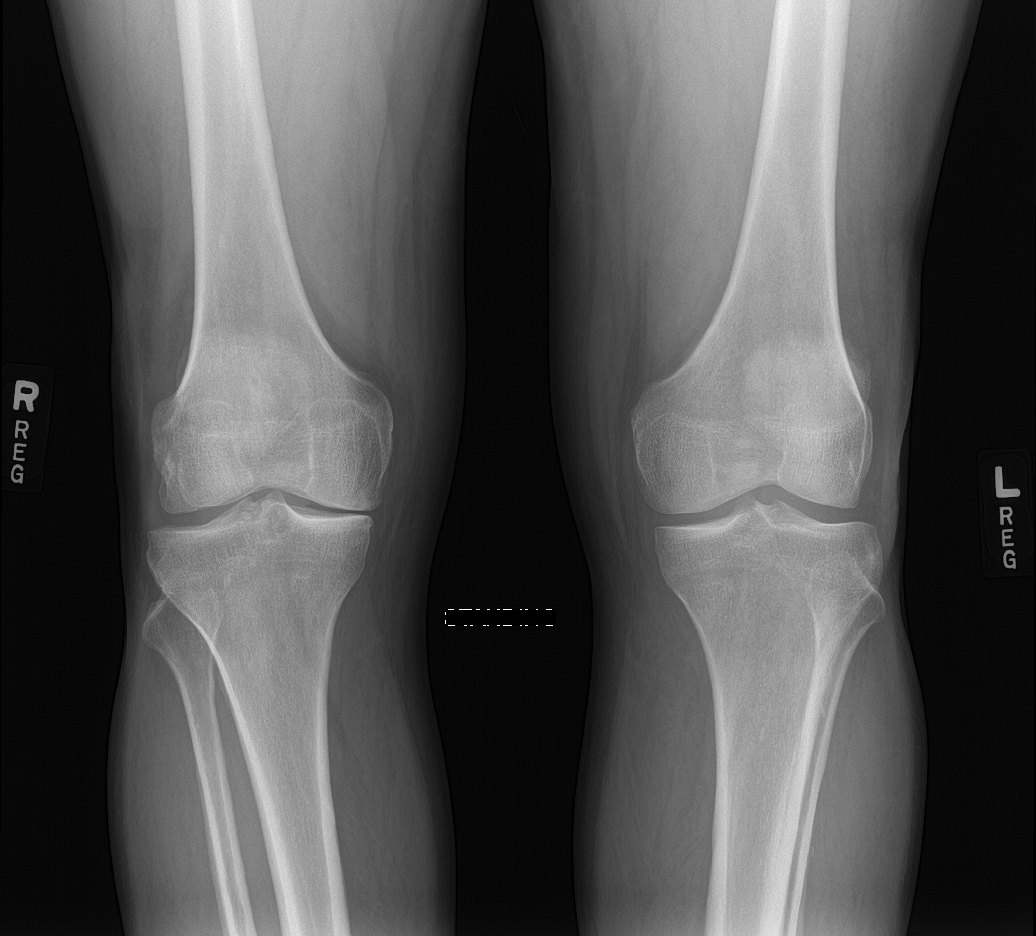

[4 of 4 positions shown; findings below may reference images not displayed]

FINDINGS: No fracture or dislocation of the bilateral knees. There is severe,
asymmetric near bone-on-bone joint space loss of the medial
compartment of the right knee, seen in frontal and tunnel standing
views only. Joint spaces of the left knee are preserved. There is a
small, nonspecific left knee joint effusion. Mild soft tissue edema
anteriorly about the left knee. Scattered vascular calcifications.
IMPRESSION: 1. No fracture or dislocation of the bilateral knees.

2. There is severe, asymmetric near bone-on-bone joint space loss of
the medial compartment of the right knee, seen in frontal and tunnel
standing views only. Joint spaces of the left knee are preserved.

3. There is a small, nonspecific left knee joint effusion. Mild soft
tissue edema anteriorly about the left knee.

## 2020-12-25 ENCOUNTER — Other Ambulatory Visit (HOSPITAL_BASED_OUTPATIENT_CLINIC_OR_DEPARTMENT_OTHER): Payer: Self-pay

## 2020-12-25 ENCOUNTER — Other Ambulatory Visit: Payer: Self-pay

## 2020-12-25 MED ORDER — CARVEDILOL 6.25 MG PO TABS
6.2500 mg | ORAL_TABLET | Freq: Two times a day (BID) | ORAL | 0 refills | Status: AC
  Filled 2020-12-25: qty 180, 90d supply, fill #0

## 2021-10-26 ENCOUNTER — Telehealth: Payer: Self-pay | Admitting: General Practice

## 2021-10-26 NOTE — Telephone Encounter (Signed)
Transition Care Management Follow-up Telephone Call Date of discharge and from where: 10/22/21 from [facility] How have you been since you were released from the hospital? Patient stated that his primary care is now in [place]. Will remove Dr. Zigmund Daniel as PCP. Any questions or concerns? No

## 2022-02-20 IMAGING — DX DG KNEE COMPLETE 4+V*R*
4 series · 4 of 4 positions shown · non-contrast
Comparison: None.

CLINICAL DATA: Right knee pain, swelling and bruising after recent
fall

EXAM:
RIGHT KNEE - COMPLETE 4+ VIEW

[knee ap]
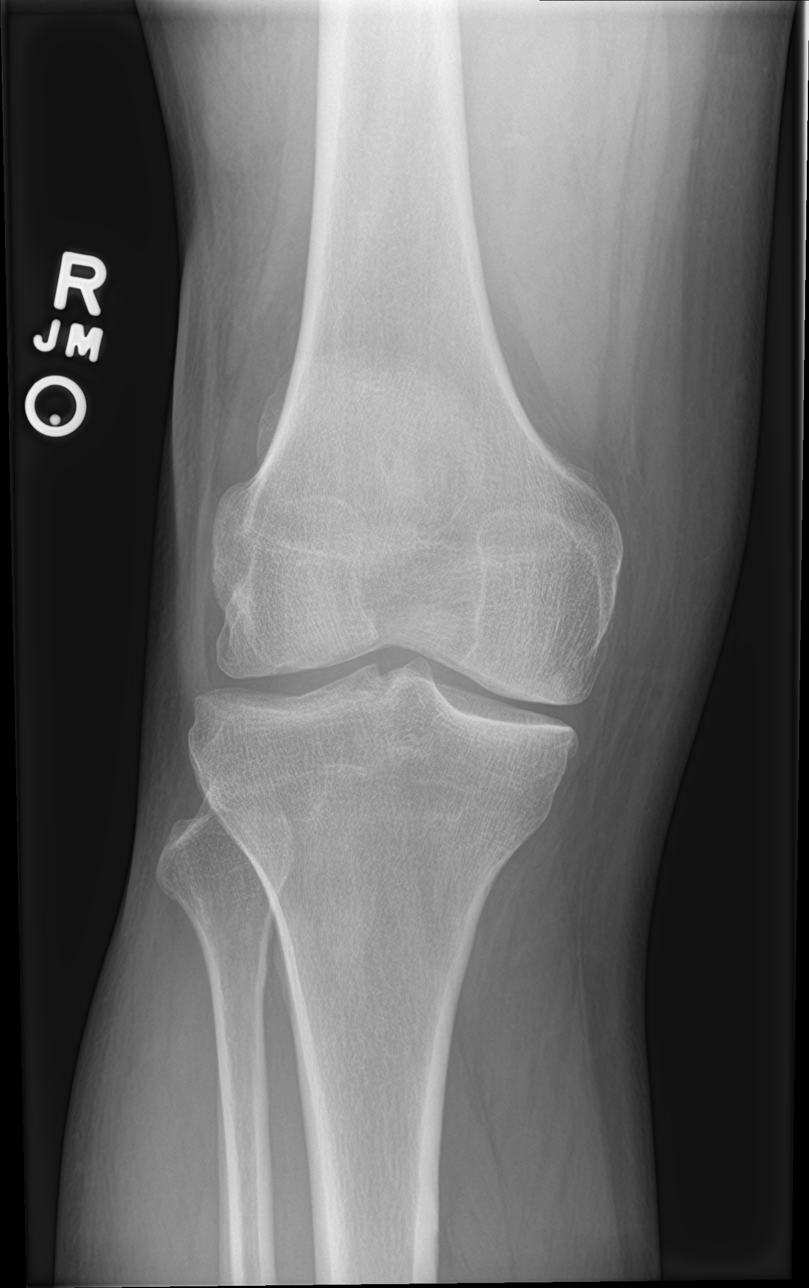

[knee lat]
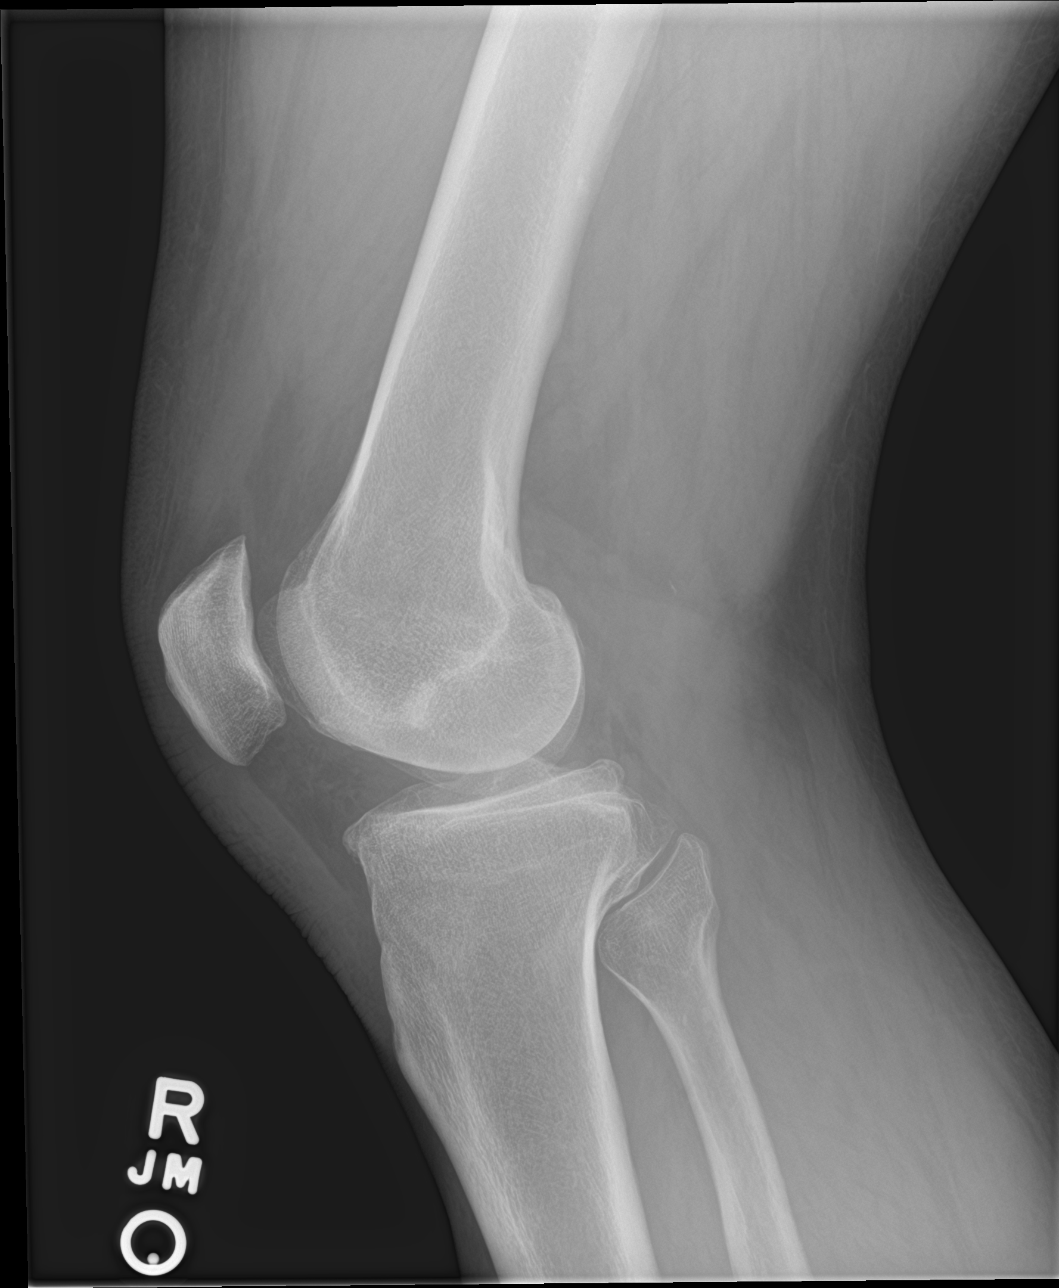

[knee obl (1 of 2)]
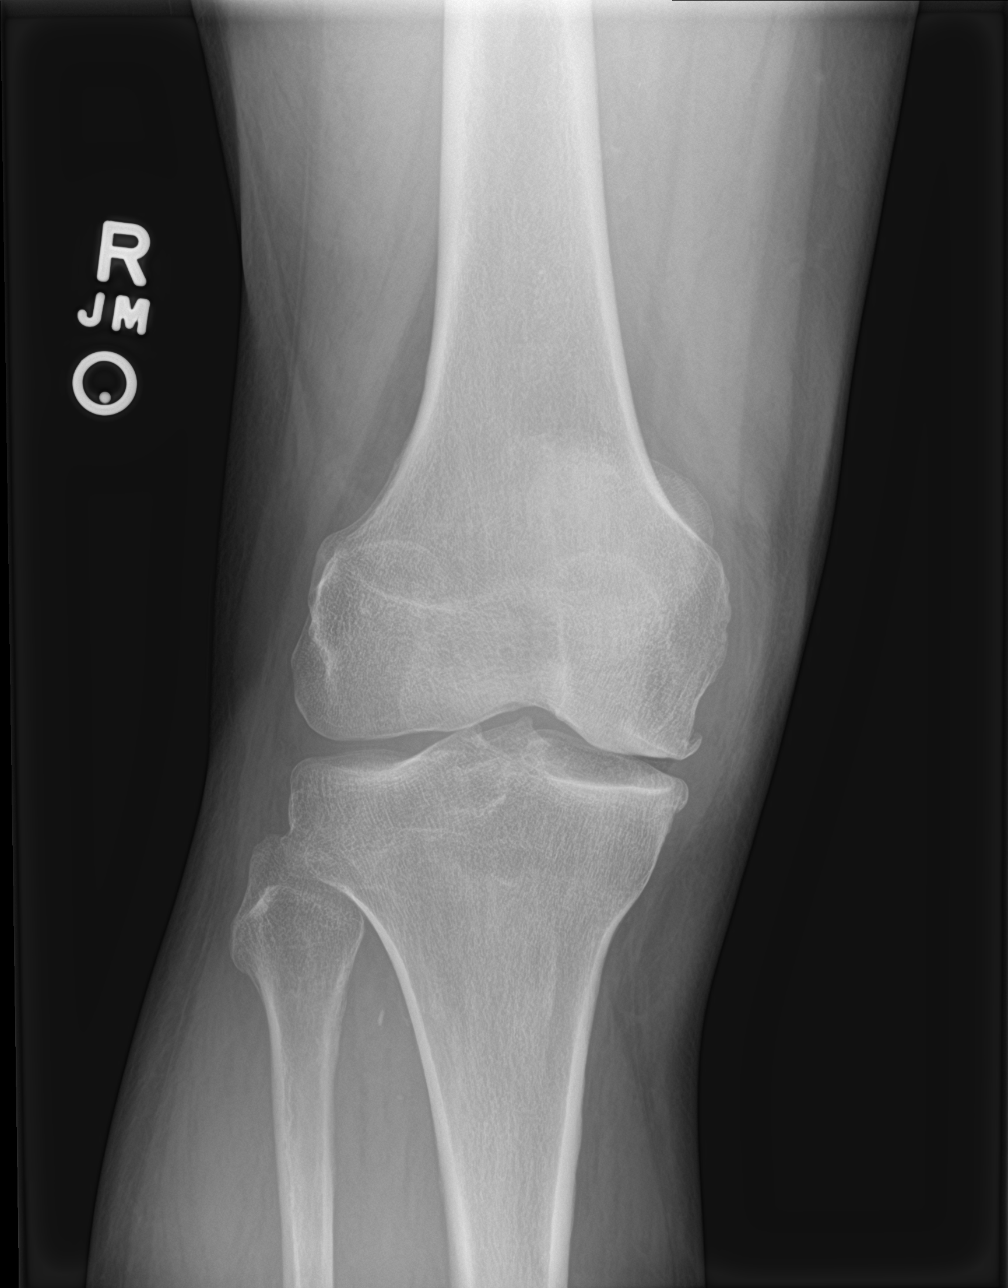

[knee obl (2 of 2)]
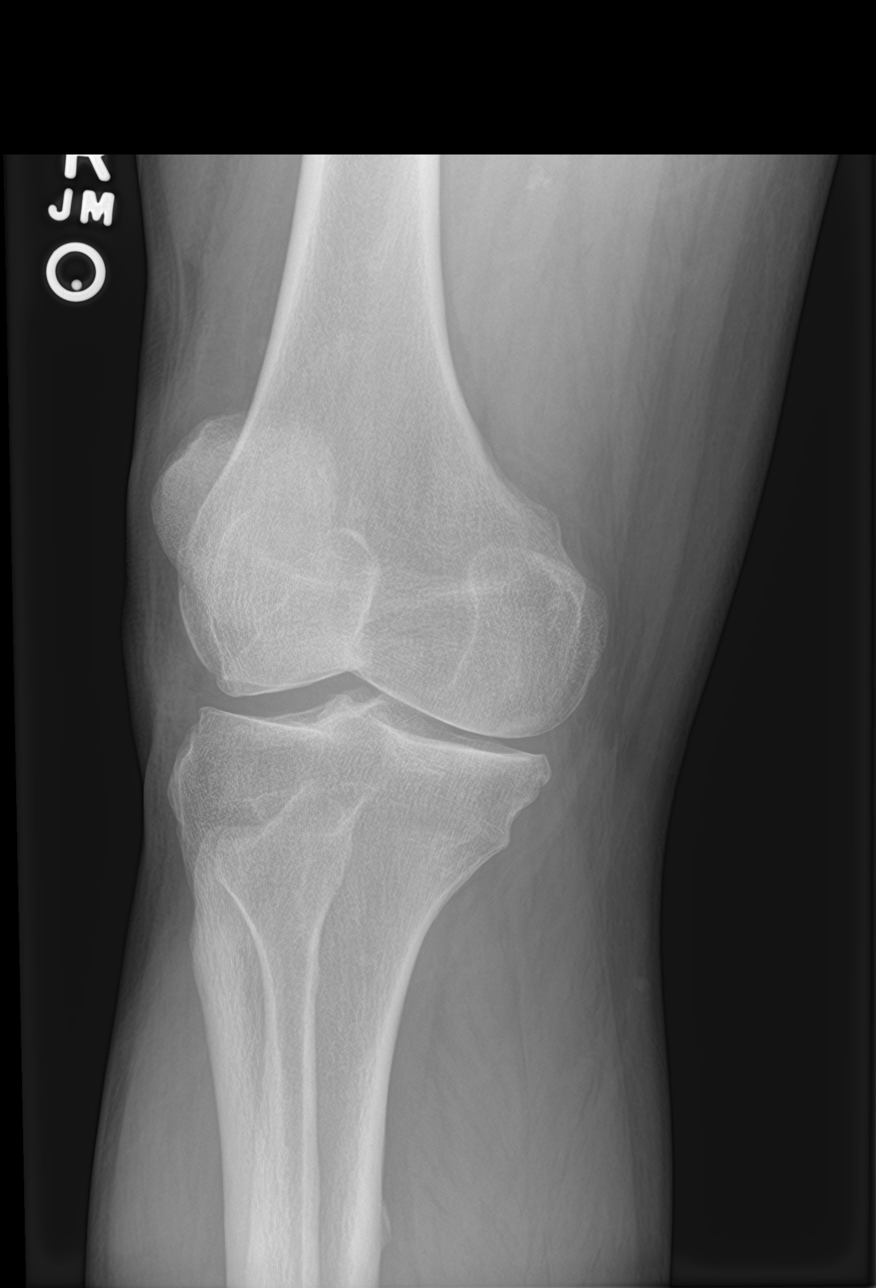

[4 of 4 positions shown; findings below may reference images not displayed]

FINDINGS: No fracture, joint effusion or dislocation. No suspicious focal
osseous lesions. Minimal medial compartment osteoarthritis.
Preserved lateral and patellofemoral compartments. No radiopaque
foreign bodies.
IMPRESSION: No fracture, joint effusion or malalignment. Minimal medial
compartment osteoarthritis.
